# Patient Record
Sex: Male | Born: 1980 | Hispanic: Yes | Smoking: Never smoker
Health system: Southern US, Community
[De-identification: ages and names within clinical notes are randomized; demographics above are authoritative.]

---

## 2014-12-19 ENCOUNTER — Emergency Department (HOSPITAL_COMMUNITY): Payer: Self-pay

## 2014-12-19 ENCOUNTER — Emergency Department (HOSPITAL_COMMUNITY)
Admission: EM | Admit: 2014-12-19 | Discharge: 2014-12-19 | Disposition: A | Discharge status: Home / Self Care | Payer: Self-pay | Attending: Emergency Medicine | Admitting: Emergency Medicine

## 2014-12-19 ENCOUNTER — Encounter (HOSPITAL_COMMUNITY): Payer: Self-pay | Admitting: Emergency Medicine

## 2014-12-19 DIAGNOSIS — N200 Calculus of kidney: Secondary | ICD-10-CM | POA: Insufficient documentation

## 2014-12-19 LAB — URINALYSIS, ROUTINE W REFLEX MICROSCOPIC
BILIRUBIN URINE: NEGATIVE
GLUCOSE, UA: NEGATIVE mg/dL
Ketones, ur: NEGATIVE mg/dL
Leukocytes, UA: NEGATIVE
Nitrite: NEGATIVE
PROTEIN: 30 mg/dL — AB
Specific Gravity, Urine: 1.024 (ref 1.005–1.030)
UROBILINOGEN UA: 1 mg/dL (ref 0.0–1.0)
pH: 5 (ref 5.0–8.0)

## 2014-12-19 LAB — CBC WITH DIFFERENTIAL/PLATELET
Basophils Absolute: 0 10*3/uL (ref 0.0–0.1)
Basophils Relative: 1 % (ref 0–1)
EOS PCT: 2 % (ref 0–5)
Eosinophils Absolute: 0.2 10*3/uL (ref 0.0–0.7)
HCT: 41.2 % (ref 39.0–52.0)
HEMOGLOBIN: 13.7 g/dL (ref 13.0–17.0)
Lymphocytes Relative: 28 % (ref 12–46)
Lymphs Abs: 2.4 10*3/uL (ref 0.7–4.0)
MCH: 27.2 pg (ref 26.0–34.0)
MCHC: 33.3 g/dL (ref 30.0–36.0)
MCV: 81.7 fL (ref 78.0–100.0)
MONO ABS: 0.5 10*3/uL (ref 0.1–1.0)
MONOS PCT: 6 % (ref 3–12)
Neutro Abs: 5.4 10*3/uL (ref 1.7–7.7)
Neutrophils Relative %: 63 % (ref 43–77)
Platelets: 210 10*3/uL (ref 150–400)
RBC: 5.04 MIL/uL (ref 4.22–5.81)
RDW: 13.2 % (ref 11.5–15.5)
WBC: 8.5 10*3/uL (ref 4.0–10.5)

## 2014-12-19 LAB — COMPREHENSIVE METABOLIC PANEL
ALT: 113 U/L — AB (ref 0–53)
AST: 67 U/L — AB (ref 0–37)
Albumin: 4.1 g/dL (ref 3.5–5.2)
Alkaline Phosphatase: 91 U/L (ref 39–117)
Anion gap: 10 (ref 5–15)
BILIRUBIN TOTAL: 1.1 mg/dL (ref 0.3–1.2)
BUN: 14 mg/dL (ref 6–23)
CO2: 25 mmol/L (ref 19–32)
Calcium: 9.2 mg/dL (ref 8.4–10.5)
Chloride: 106 mEq/L (ref 96–112)
Creatinine, Ser: 1.12 mg/dL (ref 0.50–1.35)
GFR calc Af Amer: >90 mL/min (ref >90)
GFR, EST NON AFRICAN AMERICAN: 86 mL/min — AB (ref >90)
GLUCOSE: 125 mg/dL — AB (ref 70–99)
Potassium: 3.7 mmol/L (ref 3.5–5.1)
Sodium: 141 mmol/L (ref 135–145)
Total Protein: 7.7 g/dL (ref 6.0–8.3)

## 2014-12-19 LAB — URINE MICROSCOPIC-ADD ON

## 2014-12-19 MED ORDER — HYDROMORPHONE HCL 1 MG/ML IJ SOLN
1.0000 mg | Freq: Once | INTRAMUSCULAR | Status: AC
  Administered 2014-12-19: 1 mg via INTRAVENOUS
  Filled 2014-12-19: qty 1

## 2014-12-19 MED ORDER — TAMSULOSIN HCL 0.4 MG PO CAPS: 0.4000 mg | ORAL_CAPSULE | Freq: Every day | ORAL | Status: DC

## 2014-12-19 MED ORDER — PROMETHAZINE HCL 25 MG/ML IJ SOLN: 25.0000 mg | Freq: Once | INTRAMUSCULAR | Status: DC

## 2014-12-19 MED ORDER — IOHEXOL 300 MG/ML  SOLN: 25.0000 mL | INTRAMUSCULAR | Status: AC

## 2014-12-19 MED ORDER — SODIUM CHLORIDE 0.9 % IV BOLUS (SEPSIS)
1000.0000 mL | Freq: Once | INTRAVENOUS | Status: AC
  Administered 2014-12-19: 1000 mL via INTRAVENOUS

## 2014-12-19 MED ORDER — OXYCODONE-ACETAMINOPHEN 5-325 MG PO TABS
1.0000 | ORAL_TABLET | Freq: Once | ORAL | Status: AC
  Administered 2014-12-19: 1 via ORAL
  Filled 2014-12-19: qty 1

## 2014-12-19 MED ORDER — ONDANSETRON HCL 4 MG/2ML IJ SOLN
4.0000 mg | Freq: Once | INTRAMUSCULAR | Status: AC
  Administered 2014-12-19: 4 mg via INTRAVENOUS
  Filled 2014-12-19: qty 2

## 2014-12-19 MED ORDER — TAMSULOSIN HCL 0.4 MG PO CAPS
0.4000 mg | ORAL_CAPSULE | Freq: Once | ORAL | Status: AC
  Administered 2014-12-19: 0.4 mg via ORAL
  Filled 2014-12-19: qty 1

## 2014-12-19 MED ORDER — KETOROLAC TROMETHAMINE 30 MG/ML IJ SOLN
30.0000 mg | Freq: Once | INTRAMUSCULAR | Status: AC
Start: 2014-12-19 — End: 2014-12-19
  Administered 2014-12-19: 30 mg via INTRAVENOUS
  Filled 2014-12-19: qty 1

## 2014-12-19 MED ORDER — HYDROCODONE-ACETAMINOPHEN 5-325 MG PO TABS: 1.0000 | ORAL_TABLET | Freq: Four times a day (QID) | ORAL | Status: DC | PRN

## 2014-12-19 NOTE — ED Provider Notes (Signed)
CSN: [id number]     Arrival date & time 12/19/14  1431 History   First MD Initiated Contact with Patient 12/19/14 1712     Chief Complaint  Patient presents with  . Abdominal Pain     (Consider location/radiation/quality/duration/timing/severity/associated sxs/prior Treatment) HPI Patient presents with right lower quadrant abdominal pain.  Pain is nonradiating, sore, severe, worse with past 24 hours. There is associated nausea, but no vomiting. Patient notes decreased bowel movements during this time period. He denies fever. He was generally well prior to the onset of symptoms. He has no medical problems. No relief with OTC medication.  History reviewed. No pertinent past medical history. History reviewed. No pertinent past surgical history. History reviewed. No pertinent family history. History  Substance Use Topics  . Smoking status: Never Smoker   . Smokeless tobacco: Not on file  . Alcohol Use: No    Review of Systems  Constitutional:       Per HPI, otherwise negative  HENT:       Per HPI, otherwise negative  Respiratory:       Per HPI, otherwise negative  Cardiovascular:       Per HPI, otherwise negative  Gastrointestinal: Negative for vomiting.  Endocrine:       Negative aside from HPI  Genitourinary:       Neg aside from HPI   Musculoskeletal:       Per HPI, otherwise negative  Skin: Negative.   Neurological: Negative for syncope.      Allergies  Review of patient's allergies indicates no known allergies.  Home Medications   Prior to Admission medications   Medication Sig Start Date End Date Taking? Authorizing Provider  acetaminophen (TYLENOL) 500 MG tablet Take 1,000 mg by mouth every 8 (eight) hours as needed (pain).   Yes Historical Provider, MD   BP 119/65 mmHg  Pulse 65  Temp(Src) 98.3 F (36.8 C) (Oral)  Resp 18  SpO2 96% Physical Exam  Constitutional: He is oriented to person, place, and time. He appears well-developed. No distress.   HENT:  Head: Normocephalic and atraumatic.  Eyes: Conjunctivae and EOM are normal.  Cardiovascular: Normal rate and regular rhythm.   Pulmonary/Chest: Effort normal. No stridor. No respiratory distress.  Abdominal: He exhibits no distension. There is tenderness. There is tenderness at McBurney's point.  Musculoskeletal: He exhibits no edema.  Neurological: He is alert and oriented to person, place, and time.  Skin: Skin is warm and dry.  Psychiatric: He has a normal mood and affect.  Nursing note and vitals reviewed.   ED Course  Procedures (including critical care time) Labs Review Labs Reviewed  COMPREHENSIVE METABOLIC PANEL - Abnormal; Notable for the following:    Glucose, Bld 125 (*)    AST 67 (*)    ALT 113 (*)    GFR calc non Af Amer 86 (*)    All other components within normal limits  URINALYSIS, ROUTINE W REFLEX MICROSCOPIC - Abnormal; Notable for the following:    Color, Urine AMBER (*)    APPearance CLOUDY (*)    Hgb urine dipstick LARGE (*)    Protein, ur 30 (*)    All other components within normal limits  URINE MICROSCOPIC-ADD ON - Abnormal; Notable for the following:    Bacteria, UA FEW (*)    All other components within normal limits  CBC WITH DIFFERENTIAL    Imaging Review Ct Abdomen Pelvis Wo Contrast  12/19/2014   CLINICAL DATA:  Right lower quadrant  pain, intermittent, for 3 weeks  EXAM: CT ABDOMEN AND PELVIS WITHOUT CONTRAST  TECHNIQUE: Multidetector CT imaging of the abdomen and pelvis was performed following the standard protocol without IV contrast.  COMPARISON:  None.  FINDINGS: Visualized portions of the lung bases clear.  Significant diffuse hepatic steatosis. Gallbladder and spleen are normal. Pancreas is normal. Adrenal glands are normal.  Left kidney is normal. There is mild perinephric inflammatory change on the right. There is mild to moderate right hydronephrosis. There is a 1 mm stone in the midpole of the right kidney. The entire right ureter  is mildly to moderately dilated and there is a 6 mm stone at the right ureterovesical junction.  Bladder and reproductive organs are normal. Small bowel and large bowel are normal. Stomach is normal.  Appendix shows no evidence of dilatation or inflammation. There is a 3 mm at appendicolith incidentally noted. There are a few small right lower quadrant mesenteric lymph nodes. No significant retroperitoneal adenopathy.  No free fluid.  No acute musculoskeletal findings.  IMPRESSION: Obstructive nephropathy on the right due to 6 mm stone in the distal right ureter at the right ureterovesical junction.   Electronically Signed   By: Esperanza Heir M.D.   On: 12/19/2014 21:05    On repeat exam the patient appears more comfortable. We discussed all findings. Patient will receive narcotics, be reassessed.  Patient is now sleeping  MDM   Patient presents with ongoing right lower quadrant abdominal pain. Patient is not hemodynamically unstable, and labs are largely reassuring. Evaluation demonstrates the presence of a 6 mm kidney stone. No evidence for concurrent infection. Patient was sleeping after provision of analgesics. Patient was discharged after initiation of Flomax, analgesia, to follow-up with urology.    Gerhard Munch, MD 12/19/14 2257

## 2014-12-19 NOTE — ED Notes (Signed)
The pt s pain is still a 7/10.  The pt has to be awakened to ask for pain scale

## 2014-12-19 NOTE — Discharge Instructions (Signed)
Cálculos renales °(Kidney Stones) °Los cálculos renales (urolitiasis) son masas sólidas que se forman en el interior de los riñones. El dolor intenso es causado por el movimiento de la piedra a través del riñón, uréter, vejiga y uretra (tracto urinario). Cuando la piedra se mueve, el uréter hace un espasmo alrededor de la misma. El cálculo generalmente se elimina con el pis (la orina).  °CUIDADOS EN EL HOGAR °· Debe ingerir gran cantidad de líquido para mantener la orina de tono claro o color amarillo pálido. Esto ayudará a eliminar la piedra. °· Cuele la orina con el colador que le han provisto. Noorine de otra forma que no sea a través del colador, ni siquiera una vez. Si elimina la piedra, se retendrá en el colador. Puede ser tan pequeña como un grano de sal. Llévela a su visita con el médico. Esto ayudará a que el médico le indique qué puede hacer para tratar de prevenir la ocurrencia de nuevos cálculos renales. °· Sólo tome los medicamentos que le haya indicado su médico. °· Concurra a las consultas de control con el médico, según las indicaciones. °· Hágase las radiografías según las indicaciones de su médico. °SOLICITE AYUDA SI: °Siente un dolor que empeora aún tomando analgésicos. °SOLICITE AYUDA DE INMEDIATO SI:  °· El dolor no mejora con los medicamentos recetados. °· Siente escalofríos o fiebre. °· El dolor aumenta y empeora en las siguientes 18 horas. °· Siente un nuevo dolor en el vientre (abdominal). °· Sufre mareos o se desmaya. °· Nota que no puede orinar. °ASEGÚRESE DE QUE:  °· Comprende estas instrucciones. °· Controlará su afección. °· Recibirá ayuda de inmediato si no mejora o si empeora. °Document Released: 03/01/2009 Document Revised: 08/05/2013 °[employer]® Patient Information ©2015 [employer]. This information is not intended to replace advice given to you by your health care provider. Make sure you discuss any questions you have with your health care provider. ° °

## 2014-12-19 NOTE — ED Notes (Signed)
Pt c/o RLQ pain x 3 weeks intermittently

## 2014-12-19 NOTE — ED Notes (Signed)
Pt drinking 2nd cup of oral contrast

## 2014-12-19 NOTE — ED Notes (Signed)
To ct

## 2014-12-19 NOTE — ED Notes (Signed)
The pt is drinking oral contrast.  1st cup completed/   Still hurting

## 2016-12-01 ENCOUNTER — Emergency Department (HOSPITAL_COMMUNITY)
Admission: EM | Admit: 2016-12-01 | Discharge: 2016-12-01 | Disposition: A | Discharge status: Home / Self Care | Payer: Self-pay | Attending: Emergency Medicine | Admitting: Emergency Medicine

## 2016-12-01 ENCOUNTER — Encounter (HOSPITAL_COMMUNITY): Payer: Self-pay | Admitting: *Deleted

## 2016-12-01 DIAGNOSIS — N492 Inflammatory disorders of scrotum: Secondary | ICD-10-CM | POA: Insufficient documentation

## 2016-12-01 DIAGNOSIS — N481 Balanitis: Secondary | ICD-10-CM

## 2016-12-01 MED ORDER — NYSTATIN 100000 UNIT/GM EX CREA: TOPICAL_CREAM | CUTANEOUS | 0 refills | Status: DC

## 2016-12-01 MED ORDER — LIDOCAINE HCL 2 % IJ SOLN
10.0000 mL | Freq: Once | INTRAMUSCULAR | Status: AC
  Administered 2016-12-01: 200 mg
  Filled 2016-12-01: qty 20

## 2016-12-01 MED ORDER — SULFAMETHOXAZOLE-TRIMETHOPRIM 800-160 MG PO TABS: 1.0000 | ORAL_TABLET | Freq: Two times a day (BID) | ORAL | 0 refills | Status: AC

## 2016-12-01 MED ORDER — CEPHALEXIN 500 MG PO CAPS: 500.0000 mg | ORAL_CAPSULE | Freq: Four times a day (QID) | ORAL | 0 refills | Status: DC

## 2016-12-01 NOTE — ED Triage Notes (Signed)
The pt has an abscess in his genatalia  For 2-3 weeks and his penis is irritated

## 2016-12-01 NOTE — ED Notes (Signed)
Pt. Speaks very little English, if any...his wife is in the room with him and speaks English very well

## 2016-12-01 NOTE — ED Notes (Signed)
ED Provider at bedside. 

## 2016-12-01 NOTE — ED Provider Notes (Signed)
[facility]-EMERGENCY DEPT Provider Note   CSN: [id number] Arrival date & time: 12/01/16  1829   History   Chief Complaint Chief Complaint  Patient presents with  . Abscess    HPI Alan Vaughn is a 35 y.o. male.  35 year old male with no significant past medical history presents to the emergency department for evaluation of abscess. Patient has had an abscess over the past 4 days. It has slightly increased in size and has not been draining. Wife reports the patient tried to squeeze the abscess to drain it prior to arrival, but did not expel any. Once. He has mild discomfort to the area and has not taken any medications for symptoms. No dysuria or scrotal pain. No fevers or history of abscesses.   Wife is also concerned about your rotation to the patient's foreskin. This has been present for 2-3 weeks, the patient has had similar symptoms in the past which have resolved spontaneously. Irritation to the area is worse when retracting the foreskin. Patient has no history of diabetes and he has had no bloody drainage. No inability to void.   The history is provided by the patient. A language interpreter was used (girlfriend translating).    History reviewed. No pertinent past medical history.  There are no active problems to display for this patient.   History reviewed. No pertinent surgical history.    Home Medications    Prior to Admission medications   Medication Sig Start Date End Date Taking? Authorizing Provider  cephALEXin (KEFLEX) 500 MG capsule Take 1 capsule (500 mg total) by mouth 4 (four) times daily. 12/01/16   Antony MaduraKelly Ghazi Rumpf, PA-C  HYDROcodone-acetaminophen (NORCO/VICODIN) 5-325 MG per tablet Take 1 tablet by mouth every 6 (six) hours as needed for moderate pain or severe pain. 12/19/14   Gerhard Munchobert Lockwood, MD  nystatin cream (MYCOSTATIN) Apply to foreskin 2 times daily 12/01/16   Antony MaduraKelly Jonahtan Manseau, PA-C  sulfamethoxazole-trimethoprim (BACTRIM DS,SEPTRA DS) 800-160 MG tablet Take 1  tablet by mouth 2 (two) times daily. 12/01/16 12/08/16  Antony MaduraKelly Winfield Caba, PA-C  tamsulosin (FLOMAX) 0.4 MG CAPS capsule Take 1 capsule (0.4 mg total) by mouth daily. 12/20/14   Gerhard Munchobert Lockwood, MD    Family History No family history on file.  Social History Social History  Substance Use Topics  . Smoking status: Never Smoker  . Smokeless tobacco: Never Used  . Alcohol use No     Allergies   Patient has no known allergies.   Review of Systems Review of Systems Ten systems reviewed and are negative for acute change, except as noted in the HPI.    Physical Exam Updated Vital Signs BP 106/69   Pulse 69   Temp 99.1 F (37.3 C) (Oral)   Resp 16   SpO2 97%   Physical Exam  Constitutional: He is oriented to person, place, and time. He appears well-developed and well-nourished. No distress.  Nontoxic and in NAD  HENT:  Head: Normocephalic and atraumatic.  Eyes: Conjunctivae and EOM are normal. No scleral icterus.  Neck: Normal range of motion.  Cardiovascular: Normal rate, regular rhythm and intact distal pulses.   Pulmonary/Chest: Effort normal. No respiratory distress. He has no wheezes.  Respirations even and unlabored  Genitourinary: Uncircumcised.  Genitourinary Comments: Irritation to end of foreskin with small cuts in skin and discharge; c/w balanitis. There is also an indurated abscess to the underside of the base of the penis, also associated with the anterior aspect of the scrotum. Mild TTP. No erythema or heat to  touch. No drainage.  Musculoskeletal: Normal range of motion.  Neurological: He is alert and oriented to person, place, and time. He exhibits normal muscle tone. Coordination normal.  Skin: Skin is warm and dry. No rash noted. He is not diaphoretic. No erythema. No pallor.  Psychiatric: He has a normal mood and affect. His behavior is normal.  Nursing note and vitals reviewed.    ED Treatments / Results  Labs (all labs ordered are listed, but only abnormal  results are displayed) Labs Reviewed - No data to display  EKG  EKG Interpretation None       Radiology No results found.  Procedures Procedures (including critical care time)  Medications Ordered in ED Medications  lidocaine (XYLOCAINE) 2 % (with pres) injection 200 mg (200 mg Infiltration Given by Other 12/01/16 2222)     INCISION AND DRAINAGE Performed by: Antony MaduraHUMES, Colleen Kotlarz Consent: Verbal consent obtained. Risks and benefits: risks, benefits and alternatives were discussed Type: abscess  Body area: left hemiscrotum  Anesthesia: local infiltration  Incision was made with a scalpel.  Local anesthetic: lidocaine 2% without epinephrine  Anesthetic total: 1 ml  Complexity: complex Blunt dissection to break up loculations  Drainage: purulent  Drainage amount: moderate  Packing material: none  Patient tolerance: Patient tolerated the procedure well with no immediate complications.    Initial Impression / Assessment and Plan / ED Course  I have reviewed the triage vital signs and the nursing notes.  Pertinent labs & imaging results that were available during my care of the patient were reviewed by me and considered in my medical decision making (see chart for details).  Clinical Course     Patient with skin abscess amenable to incision and drainage. Abscess was not large enough to warrant packing or drain. Wound recheck in 2 days advised. Encouraged home warm soaks and flushing. Mild signs of cellulitis is surrounding skin. Will d/c to home with Bactrim and Keflex. Nystatin also provided for balanitis; Keflex providing dual coverage in the treatment of abscess and balanitis. Return precautions discussed and provided. Patient discharged in stable condition with no unaddressed concerns.   Final Clinical Impressions(s) / ED Diagnoses   Final diagnoses:  Abscess, scrotum  Balanitis    New Prescriptions New Prescriptions   CEPHALEXIN (KEFLEX) 500 MG CAPSULE     Take 1 capsule (500 mg total) by mouth 4 (four) times daily.   NYSTATIN CREAM (MYCOSTATIN)    Apply to foreskin 2 times daily   SULFAMETHOXAZOLE-TRIMETHOPRIM (BACTRIM DS,SEPTRA DS) 800-160 MG TABLET    Take 1 tablet by mouth 2 (two) times daily.     Antony MaduraKelly Jaxon Flatt, PA-C 12/01/16 2315    Jacalyn LefevreJulie Haviland, MD 12/01/16 [id number]

## 2016-12-01 NOTE — Discharge Instructions (Signed)
Take Bactrim and Keflex until finished. Take tylenol or ibuprofen for pain. Use Nystatin to your foreskin for balanitis. Apply warm compresses to the area of your abscess 3-4 times per day to promote drainage. Follow up with a primary care doctor.

## 2018-09-14 ENCOUNTER — Emergency Department (HOSPITAL_COMMUNITY)
Admission: EM | Admit: 2018-09-14 | Discharge: 2018-09-14 | Disposition: A | Discharge status: Home / Self Care | Payer: Self-pay | Attending: Emergency Medicine | Admitting: Emergency Medicine

## 2018-09-14 ENCOUNTER — Other Ambulatory Visit: Payer: Self-pay

## 2018-09-14 DIAGNOSIS — Z79899 Other long term (current) drug therapy: Secondary | ICD-10-CM | POA: Insufficient documentation

## 2018-09-14 DIAGNOSIS — L03811 Cellulitis of head [any part, except face]: Secondary | ICD-10-CM | POA: Insufficient documentation

## 2018-09-14 MED ORDER — DOXYCYCLINE HYCLATE 100 MG PO CAPS: 100.0000 mg | ORAL_CAPSULE | Freq: Two times a day (BID) | ORAL | 0 refills | Status: DC

## 2018-09-14 MED ORDER — IBUPROFEN 600 MG PO TABS: 600.0000 mg | ORAL_TABLET | Freq: Four times a day (QID) | ORAL | 0 refills | Status: DC | PRN

## 2018-09-14 NOTE — Discharge Instructions (Addendum)
You were seen today for infection of the head.  You likely have a skin infection.  There is no obvious abscess at this time.  Continue warm compresses.  Take antibiotics as prescribed.  Avoid shaving in that area.  If you notice increasing swelling, redness, any new or worsening symptoms you should be reevaluated.

## 2018-09-14 NOTE — ED Triage Notes (Signed)
Patient c/o abscess to back of head/neck area. Painful and hard.

## 2018-09-14 NOTE — ED Provider Notes (Signed)
[facility] EMERGENCY DEPARTMENT Provider Note   CSN: [id number] Arrival date & time: 09/14/18  0019     History   Chief Complaint Chief Complaint  Patient presents with  . Abscess    HPI Alan Vaughn is a 37 y.o. male.  HPI  This is a 37 year old male who presents with pain and swelling of the right occiput.  Patient's family members at the bedside.  Reports 1 week history of pain and swelling to the right side of the occiput.  She thought it was related to him shaving.  She has been using warm compresses with no relief.  No spontaneous drainage noted.  She has not noted any increasing swelling.  No fevers or redness.  Patient does report the pain worse at night when laying down and with head movements.  Currently he rates his pain at 5 out of 10.  He has not taken anything for pain.  No past medical history on file.  There are no active problems to display for this patient.   No past surgical history on file.      Home Medications    Prior to Admission medications   Medication Sig Start Date End Date Taking? Authorizing Provider  cephALEXin (KEFLEX) 500 MG capsule Take 1 capsule (500 mg total) by mouth 4 (four) times daily. 12/01/16   Antony Madura, PA-C  doxycycline (VIBRAMYCIN) 100 MG capsule Take 1 capsule (100 mg total) by mouth 2 (two) times daily. 09/14/18   Micheala Morissette, Mayer Masker, MD  HYDROcodone-acetaminophen (NORCO/VICODIN) 5-325 MG per tablet Take 1 tablet by mouth every 6 (six) hours as needed for moderate pain or severe pain. 12/19/14   Gerhard Munch, MD  ibuprofen (ADVIL,MOTRIN) 600 MG tablet Take 1 tablet (600 mg total) by mouth every 6 (six) hours as needed. 09/14/18   Falesha Schommer, Mayer Masker, MD  nystatin cream (MYCOSTATIN) Apply to foreskin 2 times daily 12/01/16   Antony Madura, PA-C  tamsulosin (FLOMAX) 0.4 MG CAPS capsule Take 1 capsule (0.4 mg total) by mouth daily. 12/20/14   Gerhard Munch, MD    Family History No family history on  file.  Social History Social History   Tobacco Use  . Smoking status: Never Smoker  . Smokeless tobacco: Never Used  Substance Use Topics  . Alcohol use: No  . Drug use: No     Allergies   Patient has no known allergies.   Review of Systems Review of Systems  Constitutional: Negative for fever.  Skin: Negative for color change.       Pain and swelling right occipital area  Neurological: Negative for headaches.  All other systems reviewed and are negative.    Physical Exam Updated Vital Signs BP (!) 147/84 (BP Location: Right Arm)   Pulse 62   Temp 97.7 F (36.5 C) (Oral)   Resp 16   Wt 90.7 kg   SpO2 99%   Physical Exam  Constitutional: He is oriented to person, place, and time. He appears well-developed and well-nourished. No distress.  HENT:  Head: Normocephalic and atraumatic.  Tenderness to palpation right occipital area with associated induration, no obvious fluctuance, mild erythema, no no drainage or pustules noted  Neck: Normal range of motion. Neck supple.  Cardiovascular: Normal rate and regular rhythm.  Pulmonary/Chest: Effort normal. No respiratory distress.  Musculoskeletal: He exhibits no edema.  Neurological: He is alert and oriented to person, place, and time.  Skin: Skin is warm and dry.  Psychiatric: He has a normal  mood and affect.  Nursing note and vitals reviewed.    ED Treatments / Results  Labs (all labs ordered are listed, but only abnormal results are displayed) Labs Reviewed - No data to display  EKG None  Radiology No results found.  Procedures Procedures (including critical care time)  Medications Ordered in ED Medications - No data to display   Initial Impression / Assessment and Plan / ED Course  I have reviewed the triage vital signs and the nursing notes.  Pertinent labs & imaging results that were available during my care of the patient were reviewed by me and considered in my medical decision making (see  chart for details).     Patient presents with pain and swelling to the right occipital region.  He is overall nontoxic-appearing vital signs are reassuring.  Denies any fevers or systemic symptoms.  He has slight swelling and tenderness to palpation over the right occipital region with slight erythema.  No obvious fluctuance.  Bedside ultrasound does not show any definitive abscess.  Suspect cellulitis versus folliculitis.  Recommend continued warm compresses.  Will start on doxycycline.  They were given strict return precautions.  Informed patient that this could turn into an abscess which would ultimately require I&D if it increases in size.  After history, exam, and medical workup I feel the patient has been appropriately medically screened and is safe for discharge home. Pertinent diagnoses were discussed with the patient. Patient was given return precautions.   Final Clinical Impressions(s) / ED Diagnoses   Final diagnoses:  Cellulitis of head except face    ED Discharge Orders         Ordered    doxycycline (VIBRAMYCIN) 100 MG capsule  2 times daily     09/14/18 0214    ibuprofen (ADVIL,MOTRIN) 600 MG tablet  Every 6 hours PRN     09/14/18 0214           Shon Baton, MD 09/14/18 [phone]

## 2018-09-14 NOTE — ED Notes (Signed)
Patient is A&Ox4.  No signs of distress noted.  Please see providers complete history and physical exam.  

## 2018-09-14 NOTE — ED Notes (Signed)
PT states understanding of care given, follow up care, and medication prescribed. PT ambulated from ED to car with a steady gait. 

## 2021-01-16 ENCOUNTER — Ambulatory Visit: Payer: Self-pay | Admitting: Family Medicine

## 2021-08-26 ENCOUNTER — Encounter (HOSPITAL_COMMUNITY): Payer: Self-pay | Admitting: Emergency Medicine

## 2021-08-26 ENCOUNTER — Emergency Department (HOSPITAL_COMMUNITY): Payer: Self-pay

## 2021-08-26 ENCOUNTER — Emergency Department (HOSPITAL_COMMUNITY)
Admission: EM | Admit: 2021-08-26 | Discharge: 2021-08-26 | Disposition: A | Discharge status: Home / Self Care | Payer: Self-pay | Attending: Emergency Medicine | Admitting: Emergency Medicine

## 2021-08-26 DIAGNOSIS — Y990 Civilian activity done for income or pay: Secondary | ICD-10-CM | POA: Insufficient documentation

## 2021-08-26 DIAGNOSIS — W19XXXA Unspecified fall, initial encounter: Secondary | ICD-10-CM

## 2021-08-26 DIAGNOSIS — M546 Pain in thoracic spine: Secondary | ICD-10-CM | POA: Insufficient documentation

## 2021-08-26 DIAGNOSIS — M25522 Pain in left elbow: Secondary | ICD-10-CM | POA: Insufficient documentation

## 2021-08-26 DIAGNOSIS — M549 Dorsalgia, unspecified: Secondary | ICD-10-CM

## 2021-08-26 DIAGNOSIS — W1789XA Other fall from one level to another, initial encounter: Secondary | ICD-10-CM | POA: Insufficient documentation

## 2021-08-26 DIAGNOSIS — R42 Dizziness and giddiness: Secondary | ICD-10-CM | POA: Insufficient documentation

## 2021-08-26 DIAGNOSIS — M25512 Pain in left shoulder: Secondary | ICD-10-CM | POA: Insufficient documentation

## 2021-08-26 DIAGNOSIS — M25511 Pain in right shoulder: Secondary | ICD-10-CM | POA: Insufficient documentation

## 2021-08-26 LAB — I-STAT CHEM 8, ED
BUN: 9 mg/dL (ref 6–20)
Calcium, Ion: 1.23 mmol/L (ref 1.15–1.40)
Chloride: 102 mmol/L (ref 98–111)
Creatinine, Ser: 0.8 mg/dL (ref 0.61–1.24)
Glucose, Bld: 196 mg/dL — ABNORMAL HIGH (ref 70–99)
HCT: 40 % (ref 39.0–52.0)
Hemoglobin: 13.6 g/dL (ref 13.0–17.0)
Potassium: 3.7 mmol/L (ref 3.5–5.1)
Sodium: 140 mmol/L (ref 135–145)
TCO2: 27 mmol/L (ref 22–32)

## 2021-08-26 MED ORDER — IOHEXOL 350 MG/ML SOLN
100.0000 mL | Freq: Once | INTRAVENOUS | Status: AC | PRN
  Administered 2021-08-26: 100 mL via INTRAVENOUS

## 2021-08-26 MED ORDER — HYDROCODONE-ACETAMINOPHEN 5-325 MG PO TABS
1.0000 | ORAL_TABLET | Freq: Once | ORAL | Status: AC
  Administered 2021-08-26: 1 via ORAL
  Filled 2021-08-26: qty 1

## 2021-08-26 MED ORDER — CYCLOBENZAPRINE HCL 10 MG PO TABS: 10.0000 mg | ORAL_TABLET | Freq: Every evening | ORAL | 0 refills | Status: DC | PRN

## 2021-08-26 NOTE — ED Triage Notes (Addendum)
Pt fell approx 15 ft off a roof yesterday at work.  Denies LOC.  Mild dizziness post-fall but denies at present.  C/o L shoulder and L arm pain and mild pain to R arm.  Ambulatory to triage.

## 2021-08-26 NOTE — ED Notes (Signed)
Pt verbalized understanding of d/c instructions, meds and followup care. Denies questions. VSS, no distress noted. Steady gait to exit.  

## 2021-08-26 NOTE — ED Provider Notes (Signed)
[facility] EMERGENCY DEPARTMENT Provider Note   CSN: [id number] Arrival date & time: 08/26/21  1027     History No chief complaint on file.   Alan Vaughn is a 40 y.o. male.  HPI Patient is a 40 year old male who presents to the emergency department due to a fall that occurred yesterday while at work.  Patient states he was working on a roof and fell about 15 feet to the ground.  States that he fell on his left side and reports pain to his left upper extremity diffusely but states it is worse along the left shoulder and elbow.  Also reports neck pain, upper back pain, as well as right arm pain.  States that he was holding a board in his right arm that subsequently landed on it when he fell resulting in his pain in the right shoulder.  States his pain was initially mild yesterday but worsened significantly after waking up this morning so he came to the emergency department.  Denies any head trauma or LOC during the fall but states that he was dizzy/lightheaded for about 5 minutes after hitting the ground and states that his symptoms then completely resolved.  Denies any recurrent dizziness or lightheadedness.  Denies any abdominal pain.    History reviewed. No pertinent past medical history.  There are no problems to display for this patient.   History reviewed. No pertinent surgical history.     No family history on file.  Social History   Tobacco Use   Smoking status: Never   Smokeless tobacco: Never  Substance Use Topics   Alcohol use: No   Drug use: No    Home Medications Prior to Admission medications   Medication Sig Start Date End Date Taking? Authorizing Provider  cyclobenzaprine (FLEXERIL) 10 MG tablet Take 1 tablet (10 mg total) by mouth at bedtime as needed for muscle spasms. 08/26/21  Yes Placido Sou, PA-C  cephALEXin (KEFLEX) 500 MG capsule Take 1 capsule (500 mg total) by mouth 4 (four) times daily. 12/01/16   Antony Madura, PA-C   doxycycline (VIBRAMYCIN) 100 MG capsule Take 1 capsule (100 mg total) by mouth 2 (two) times daily. 09/14/18   Horton, Mayer Masker, MD  HYDROcodone-acetaminophen (NORCO/VICODIN) 5-325 MG per tablet Take 1 tablet by mouth every 6 (six) hours as needed for moderate pain or severe pain. 12/19/14   Gerhard Munch, MD  ibuprofen (ADVIL,MOTRIN) 600 MG tablet Take 1 tablet (600 mg total) by mouth every 6 (six) hours as needed. 09/14/18   Horton, Mayer Masker, MD  nystatin cream (MYCOSTATIN) Apply to foreskin 2 times daily 12/01/16   Antony Madura, PA-C  tamsulosin (FLOMAX) 0.4 MG CAPS capsule Take 1 capsule (0.4 mg total) by mouth daily. 12/20/14   Gerhard Munch, MD    Allergies    Patient has no known allergies.  Review of Systems   Review of Systems  All other systems reviewed and are negative. Ten systems reviewed and are negative for acute change, except as noted in the HPI.   Physical Exam Updated Vital Signs BP 123/77 (BP Location: Right Arm)   Pulse (!) 57   Temp 98.6 F (37 C) (Oral)   Resp 19   SpO2 97%   Physical Exam Vitals and nursing note reviewed.  Constitutional:      General: He is not in acute distress.    Appearance: Normal appearance. He is not ill-appearing, toxic-appearing or diaphoretic.  HENT:     Head: Normocephalic and atraumatic.  Comments: No visible signs of trauma.    Right Ear: External ear normal.     Left Ear: External ear normal.     Nose: Nose normal.     Mouth/Throat:     Mouth: Mucous membranes are moist.     Pharynx: Oropharynx is clear. No oropharyngeal exudate or posterior oropharyngeal erythema.  Eyes:     Extraocular Movements: Extraocular movements intact.  Neck:     Comments: No midline C, T, or L-spine tenderness.  No step-offs, crepitus, or deformities. Cardiovascular:     Rate and Rhythm: Normal rate and regular rhythm.     Pulses: Normal pulses.     Heart sounds: Normal heart sounds. No murmur heard.   No friction rub. No gallop.   Pulmonary:     Effort: Pulmonary effort is normal. No respiratory distress.     Breath sounds: Normal breath sounds. No stridor. No wheezing, rhonchi or rales.     Comments: Lungs are clear to auscultation bilaterally.  No wheezing, rales, or rhonchi.  Symmetrical rise and fall the chest with inspiration and expiration.  No flail chest. Abdominal:     General: Abdomen is flat.     Palpations: Abdomen is soft.     Tenderness: There is no abdominal tenderness.     Comments: Abdomen flat, soft, and nontender.  No visible signs of trauma.  No ecchymosis.  Musculoskeletal:        General: Tenderness present. Normal range of motion.     Cervical back: Normal range of motion and neck supple. No tenderness.     Comments: Mild tenderness appreciated circumferentially overlying the right shoulder.  Full active and passive range of motion of the right shoulder.  No tenderness appreciated in the right elbow or wrist.  Strength is 5/5 in the right arm.  Grip strength intact.  2+ radial pulses.  Distal sensation intact.  Moderate tenderness noted circumferentially around the left shoulder.  Unable to assess range of motion due to patient's pain.  Additional moderate tenderness noted along the olecranon and bilateral epicondyles of the left elbow.  Full passive range of motion of the left elbow.  No overlying signs of trauma.  No tenderness appreciated in the left wrist.  2+ radial pulses.  Distal sensation intact.  Mild tenderness noted diffusely along the bilateral trapezius musculature.  Skin:    General: Skin is warm and dry.  Neurological:     General: No focal deficit present.     Mental Status: He is alert and oriented to person, place, and time.     Comments: A&O x3.  Speaking clearly, coherently, and in complete sentences.  Difficulty moving the left upper extremity secondary to pain but otherwise moving the other 3 extremities with ease.  Able to stand and ambulate unassisted with a steady gait.   Psychiatric:        Mood and Affect: Mood normal.        Behavior: Behavior normal.   ED Results / Procedures / Treatments   Labs (all labs ordered are listed, but only abnormal results are displayed) Labs Reviewed  I-STAT CHEM 8, ED - Abnormal; Notable for the following components:      Result Value   Glucose, Bld 196 (*)    All other components within normal limits    EKG None  Radiology DG Ribs Unilateral W/Chest Right  Result Date: 08/26/2021 CLINICAL DATA:  Right rib pain after falling off a roof yesterday. EXAM: RIGHT RIBS AND CHEST -  3+ VIEW COMPARISON:  None. FINDINGS: Normal sized heart. Clear lungs. Mild peribronchial thickening. No rib fracture or pneumothorax. Small rounded metallic density suggesting a small pellet overlying the upper L4 vertebral body on the left. IMPRESSION: No acute abnormality. Electronically Signed   By: Beckie Salts M.D.   On: 08/26/2021 13:37   DG Shoulder Right  Result Date: 08/26/2021 CLINICAL DATA:  Right shoulder pain after falling off a roof yesterday. EXAM: RIGHT SHOULDER - 2+ VIEW COMPARISON:  None. FINDINGS: Mild greater tuberosity hyperostosis. No fracture or dislocation seen. IMPRESSION: No fracture or dislocation. Electronically Signed   By: Beckie Salts M.D.   On: 08/26/2021 13:38   DG Elbow Complete Left  Result Date: 08/26/2021 CLINICAL DATA:  Left arm pain following a fall off a roof yesterday. EXAM: LEFT ELBOW - COMPLETE 3+ VIEW COMPARISON:  None. FINDINGS: Corticated periosteal ossification and adjacent corticated soft tissue calcifications compatible with an old injury. No acute fracture or dislocation. No effusion. Mild coronoid process spur formation. IMPRESSION: No acute fracture or dislocation. Electronically Signed   By: Beckie Salts M.D.   On: 08/26/2021 13:35   DG Wrist Complete Left  Result Date: 08/26/2021 CLINICAL DATA:  Left wrist pain after falling off a roof yesterday. EXAM: LEFT WRIST - COMPLETE 3+ VIEW COMPARISON:   Left hand radiographs obtained at the same time. FINDINGS: There is no evidence of fracture or dislocation. There is no evidence of arthropathy or other focal bone abnormality. Soft tissues are unremarkable. IMPRESSION: Normal examination. Electronically Signed   By: Beckie Salts M.D.   On: 08/26/2021 13:39   CT HEAD WO CONTRAST ( )  Result Date: 08/26/2021 CLINICAL DATA:  Larey Seat 15 feet off a roof yesterday. EXAM: CT HEAD WITHOUT CONTRAST TECHNIQUE: Contiguous axial images were obtained from the base of the skull through the vertex without intravenous contrast. COMPARISON:  None. FINDINGS: Brain: No evidence of acute infarction, hemorrhage, hydrocephalus, extra-axial collection or mass lesion/mass effect. Vascular: No hyperdense vessel or unexpected calcification. Skull: Normal. Negative for fracture or focal lesion. Sinuses/Orbits: No acute finding. Other: None. IMPRESSION: 1. Normal noncontrast head CT. Electronically Signed   By: Obie Dredge M.D.   On: 08/26/2021 18:21   CT Chest W Contrast  Result Date: 08/26/2021 CLINICAL DATA:  Larey Seat off roof yesterday, bilateral upper extremity pain EXAM: CT CHEST WITH CONTRAST TECHNIQUE: Multidetector CT imaging of the chest was performed during intravenous contrast administration. CONTRAST:  OMNIPAQUE IOHEXOL 350 MG/ML SOLN COMPARISON:  08/26/2021 FINDINGS: Cardiovascular: The heart and great vessels are unremarkable without pericardial effusion. No evidence of vascular injury. Normal caliber of the thoracic aorta without aneurysm or dissection. Mediastinum/Nodes: No enlarged mediastinal, hilar, or axillary lymph nodes. Thyroid gland, trachea, and esophagus demonstrate no significant findings. Lungs/Pleura: No acute airspace disease, effusion, or pneumothorax. Central airways are patent. Upper Abdomen: There is diffuse hepatic steatosis. No acute upper abdominal finding. Musculoskeletal: There are no acute displaced fractures. Reconstructed images  demonstrate no additional findings. IMPRESSION: 1. No acute intrathoracic trauma. 2. Hepatic steatosis. Electronically Signed   By: Sharlet Salina M.D.   On: 08/26/2021 18:16   CT Cervical Spine Wo Contrast  Result Date: 08/26/2021 CLINICAL DATA:  Neck trauma, dangerous mechanism by report in a 40 year old male. Neck pain after fall. EXAM: CT CERVICAL SPINE WITHOUT CONTRAST TECHNIQUE: Multidetector CT imaging of the cervical spine was performed without intravenous contrast. Multiplanar CT image reconstructions were also generated. COMPARISON:  None FINDINGS: Alignment: Mild reversal of normal cervical lordotic curvature and  straightening, likely related to patient position. No sign of acute fracture. Skull base and vertebrae: No acute fracture. No primary bone lesion or focal pathologic process. Soft tissues and spinal canal: No prevertebral fluid or swelling. No visible canal hematoma. Disc levels:  Vertebral body heights and disc spaces are maintained. Upper chest: Negative. Other: None. IMPRESSION: No acute fracture or static subluxation of the cervical spine. Mild reversal of normal cervical lordotic curvature and straightening, likely related to patient position. Electronically Signed   By: Donzetta Kohut M.D.   On: 08/26/2021 14:53   DG Shoulder Left  Result Date: 08/26/2021 CLINICAL DATA:  Left arm and chest pain following a fall off a roof yesterday. EXAM: LEFT SHOULDER - 2+ VIEW COMPARISON:  None. FINDINGS: There is no evidence of fracture or dislocation. There is no evidence of arthropathy or other focal bone abnormality. Soft tissues are unremarkable. IMPRESSION: Normal examination. Electronically Signed   By: Beckie Salts M.D.   On: 08/26/2021 13:32   DG Hand Complete Left  Result Date: 08/26/2021 CLINICAL DATA:  Left hand pain after falling off a roof yesterday. EXAM: LEFT HAND - COMPLETE 3+ VIEW COMPARISON:  None. FINDINGS: There is no evidence of fracture or dislocation. There is no  evidence of arthropathy or other focal bone abnormality. Soft tissues are unremarkable. IMPRESSION: Normal examination. Electronically Signed   By: Beckie Salts M.D.   On: 08/26/2021 13:35    Procedures Procedures   Medications Ordered in ED Medications  HYDROcodone-acetaminophen (NORCO/VICODIN) 5-325 MG per tablet 1 tablet (1 tablet Oral Given 08/26/21 1737)  iohexol (OMNIPAQUE) 350 MG/ML injection 100 mL (100 mLs Intravenous Contrast Given 08/26/21 1801)    ED Course  I have reviewed the triage vital signs and the nursing notes.  Pertinent labs & imaging results that were available during my care of the patient were reviewed by me and considered in my medical decision making (see chart for details).    MDM Rules/Calculators/A&P                          Pt is a 40 y.o. male who is right-hand dominant the presents to the emergency department due to a fall from a roof that occurred yesterday.  Labs: I-STAT Chem-8's with a glucose of 196.  Imaging: X-rays obtained of the right ribs, chest, shoulders, left elbow, left wrist, and left hand, with additional CT scans of the head, cervical spine, and chest.  I, Placido Sou, PA-C, personally reviewed and evaluated these images and lab results as part of my medical decision-making.  Patient underwent significant imaging with findings as noted above.  All imaging is reassuring.  Neurovascularly intact in all 4 extremities.  Ambulatory with a steady gait.  No red flags.  Feel the patient is stable for discharge at this time.  Will discharge on a course of Flexeril.  We discussed safety regarding this medication.  Patient given a referral to orthopedics and recommended that they reach out to them to schedule a follow-up appointment.  We will place patient in a sling for comfort.  We discussed return precautions.  His questions were answered and he was amicable at the time of discharge.  Note: Portions of this report may have been transcribed  using voice recognition software. Every effort was made to ensure accuracy; however, inadvertent computerized transcription errors may be present.   Final Clinical Impression(s) / ED Diagnoses Final diagnoses:  Fall, initial encounter  Acute pain of  both shoulders  Left elbow pain  Upper back pain   Rx / DC Orders ED Discharge Orders          Ordered    cyclobenzaprine (FLEXERIL) 10 MG tablet  At bedtime PRN        08/26/21 1837             Placido SouJoldersma, Rozann Holts, PA-C 08/26/21 1850    Terald Sleeperrifan, Matthew J, MD 08/27/21 [phone]

## 2021-08-26 NOTE — ED Provider Notes (Signed)
Emergency Medicine Provider Triage Evaluation Note  Alan Vaughn , a 40 y.o. male  was evaluated in triage.  Pt complains of left shoulder, forearm, elbow, wrist, and hand pain as well as right shoulder pain and neck pain after fall from 15 feet height from a roof while at work yesterday onto his left side.  Denies head trauma or LOC, nausea, vomiting, blurry or double vision since that time.  Patient did not present for evaluation yesterday as he was not hurting..  Review of Systems  Positive: Neck pain, left upper extremity pain and right shoulder pain Negative: Blurry vision, double vision, nausea, vomiting  Physical Exam  BP 128/77   Pulse 63   Temp 98.6 F (37 C) (Oral)   Resp 18   SpO2 100%  Gen:   Awake, no distress   Resp:  Normal effort  MSK:   Moves extremities without difficulty  Other:  Tenderness palpation of the entirety of the left upper extremity primarily in the shoulder with some deformity concerning for possible dislocation.  Abrasion and tenderness palpation over the right anterior shoulder and chest wall.  Tenderness palpation over the cervical spine with full range of motion actively with minimal pain.  Medical Decision Making  Medically screening exam initiated at 11:59 AM.  Appropriate orders placed.  Alan Vaughn was informed that the remainder of the evaluation will be completed by another provider, this initial triage assessment does not replace that evaluation, and the importance of remaining in the ED until their evaluation is complete.  This chart was dictated using voice recognition software, Dragon. Despite the best efforts of this provider to proofread and correct errors, errors may still occur which can change documentation meaning.    Paris Lore, PA-C 08/26/21 1210    Maia Plan, MD 09/03/21 1743

## 2021-08-26 NOTE — Discharge Instructions (Signed)
I recommend a combination of tylenol and ibuprofen for management of your pain. You can take a low dose of both at the same time. I recommend 500 mg of Tylenol combined with 600 mg of ibuprofen. This is one maximum strength Tylenol and three regular ibuprofen. You can take these 2-3 times for day for your pain. Please try to take these medications with a small amount of food as well to prevent upsetting your stomach.  Also, please consider topical pain relieving creams such as Voltaran Gel, BioFreeze, or Icy Hot. There is also a pain relieving cream made by [employer]. You should be able to find all of these at your local pharmacy.   I am prescribing you a strong muscle relaxer called flexeril. Please only take this medication once in the evening with dinner. This medication can make you quite drowsy. Do not mix it with alcohol. Do not drive a vehicle after taking it.   Below is the contact information for Dr. Aundria Rud.  He is a local orthopedic specialist.  Please give them a call and schedule an appointment for reevaluation of your injuries.  If you develop any new or worsening symptoms please come back to the emergency department.  It was a pleasure to meet you.

## 2022-06-17 IMAGING — DX DG RIBS W/ CHEST 3+V*R*
5 series · 5 of 5 positions shown · non-contrast
Comparison: None.

CLINICAL DATA: Right rib pain after falling off a roof yesterday.

EXAM:
RIGHT RIBS AND CHEST - 3+ VIEW

[chest pa]
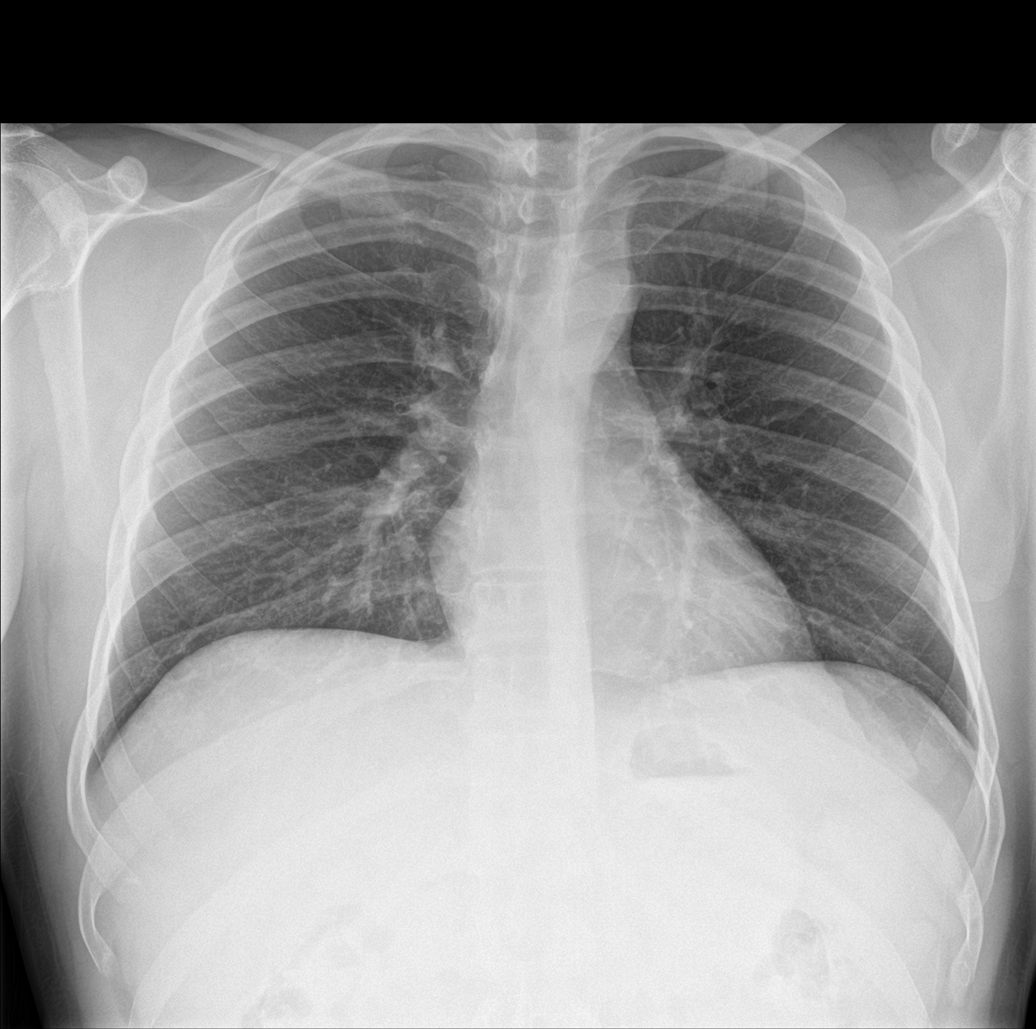

[rib ap (1 of 2)]
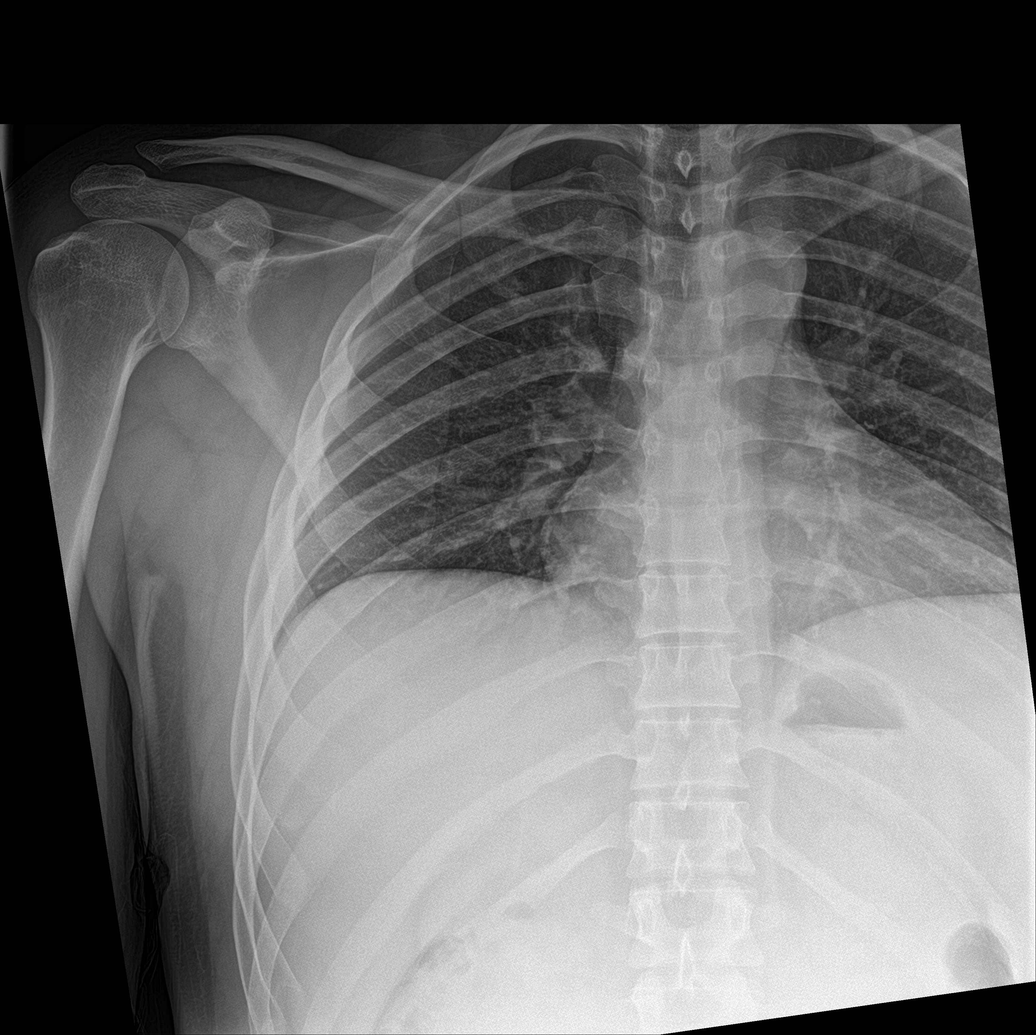

[rib ap (2 of 2)]
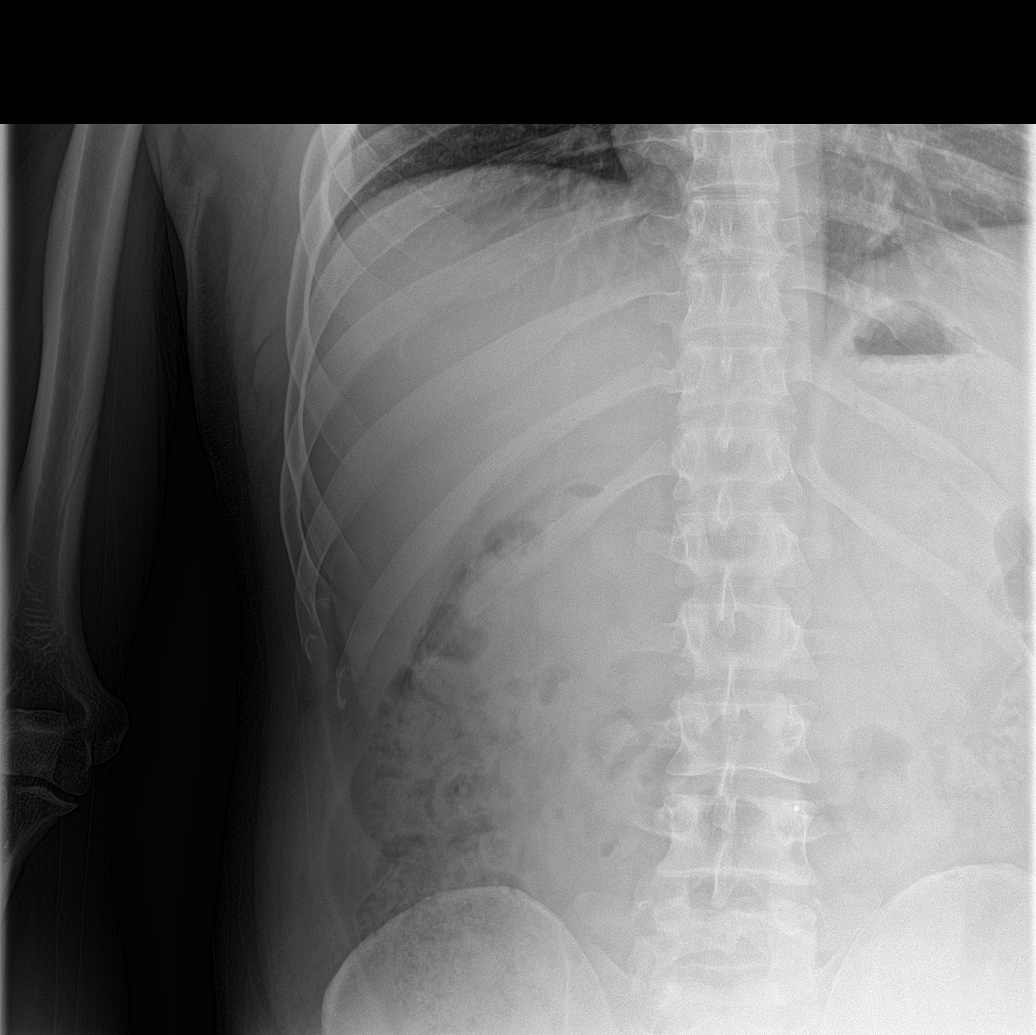

[rib ap obl (1 of 2)]
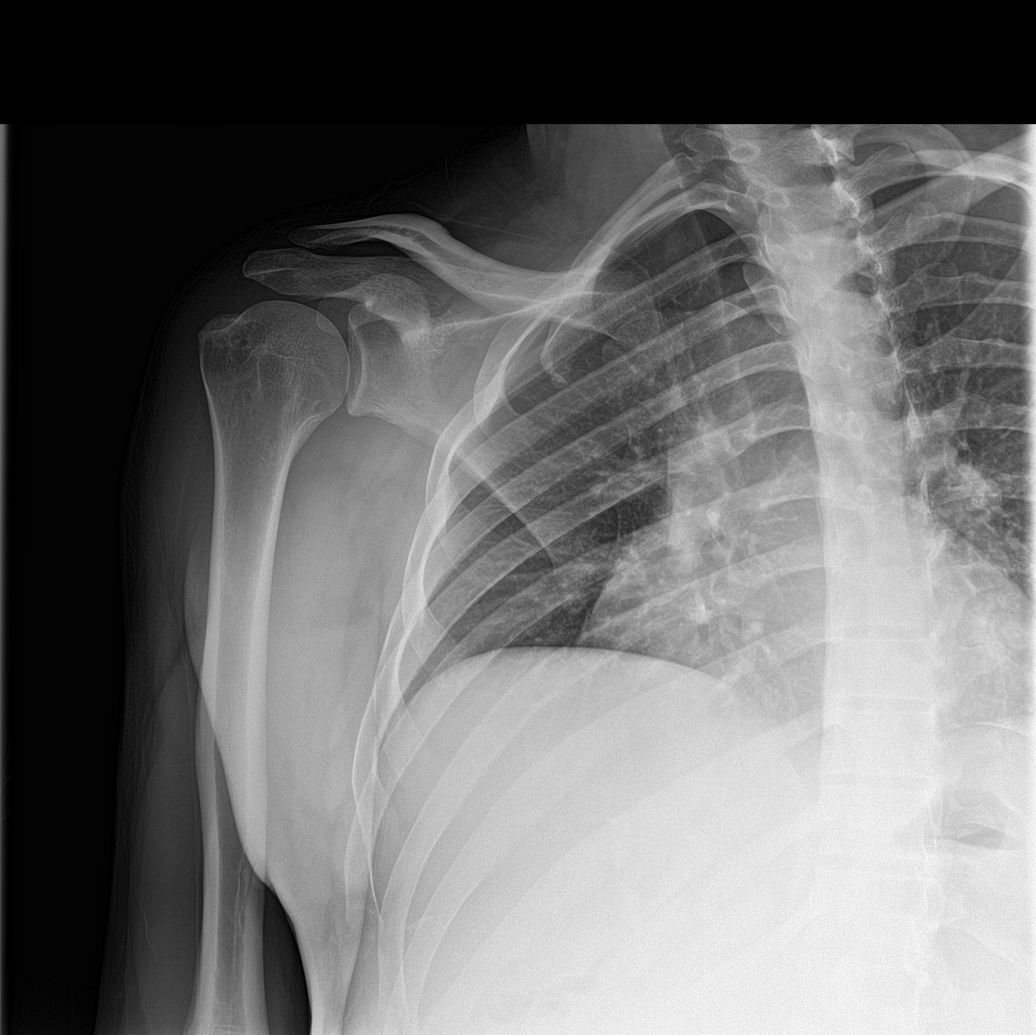

[rib ap obl (2 of 2)]
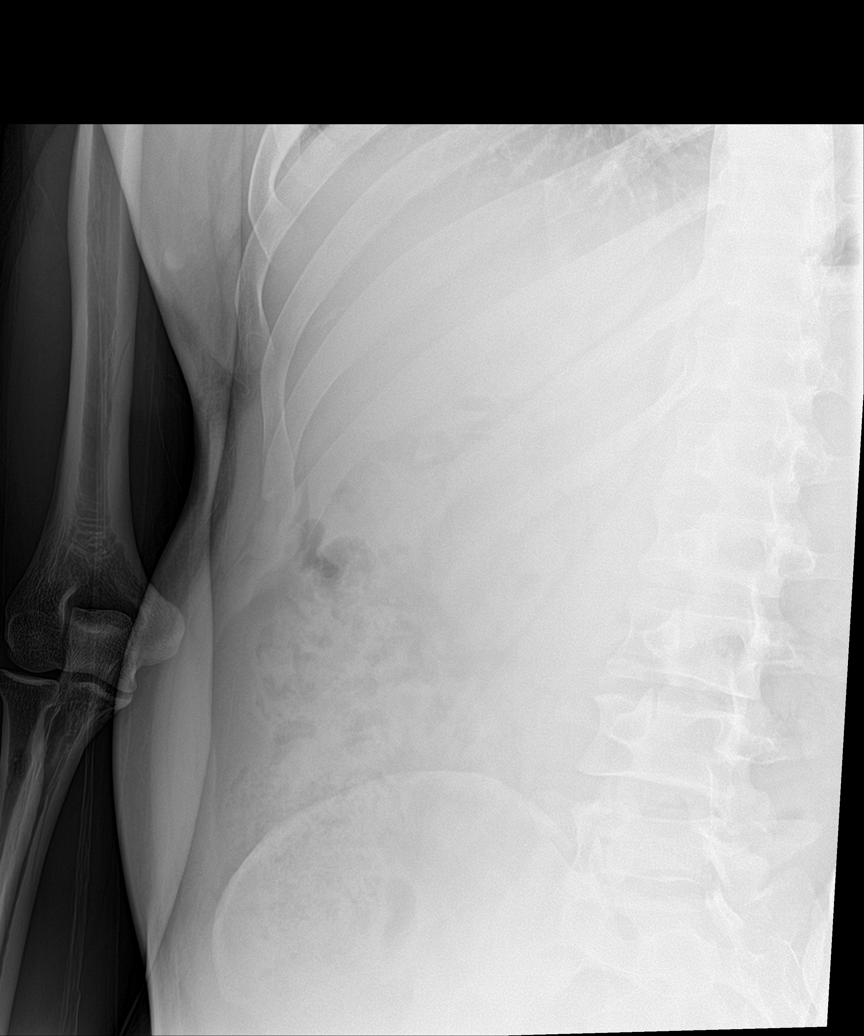

[5 of 5 positions shown; findings below may reference images not displayed]

FINDINGS: Normal sized heart. Clear lungs. Mild peribronchial thickening. No
rib fracture or pneumothorax. Small rounded metallic density
suggesting a small pellet overlying the upper L4 vertebral body on
the left.
IMPRESSION: No acute abnormality.

## 2022-09-20 ENCOUNTER — Emergency Department (HOSPITAL_COMMUNITY): Payer: Self-pay

## 2022-09-20 ENCOUNTER — Emergency Department (HOSPITAL_COMMUNITY)
Admission: EM | Admit: 2022-09-20 | Discharge: 2022-09-20 | Disposition: A | Discharge status: Home / Self Care | Payer: Self-pay | Attending: Emergency Medicine | Admitting: Emergency Medicine

## 2022-09-20 DIAGNOSIS — N23 Unspecified renal colic: Secondary | ICD-10-CM | POA: Insufficient documentation

## 2022-09-20 DIAGNOSIS — R739 Hyperglycemia, unspecified: Secondary | ICD-10-CM

## 2022-09-20 DIAGNOSIS — R7309 Other abnormal glucose: Secondary | ICD-10-CM | POA: Insufficient documentation

## 2022-09-20 LAB — I-STAT CHEM 8, ED
BUN: 19 mg/dL (ref 6–20)
Calcium, Ion: 1.13 mmol/L — ABNORMAL LOW (ref 1.15–1.40)
Chloride: 104 mmol/L (ref 98–111)
Creatinine, Ser: 1 mg/dL (ref 0.61–1.24)
Glucose, Bld: 324 mg/dL — ABNORMAL HIGH (ref 70–99)
HCT: 46 % (ref 39.0–52.0)
Hemoglobin: 15.6 g/dL (ref 13.0–17.0)
Potassium: 4.3 mmol/L (ref 3.5–5.1)
Sodium: 138 mmol/L (ref 135–145)
TCO2: 22 mmol/L (ref 22–32)

## 2022-09-20 LAB — CBC WITH DIFFERENTIAL/PLATELET
Abs Immature Granulocytes: 0.03 10*3/uL (ref 0.00–0.07)
Basophils Absolute: 0.1 10*3/uL (ref 0.0–0.1)
Basophils Relative: 1 %
Eosinophils Absolute: 0.1 10*3/uL (ref 0.0–0.5)
Eosinophils Relative: 1 %
HCT: 47 % (ref 39.0–52.0)
Hemoglobin: 15.8 g/dL (ref 13.0–17.0)
Immature Granulocytes: 0 %
Lymphocytes Relative: 24 %
Lymphs Abs: 2 10*3/uL (ref 0.7–4.0)
MCH: 27 pg (ref 26.0–34.0)
MCHC: 33.6 g/dL (ref 30.0–36.0)
MCV: 80.2 fL (ref 80.0–100.0)
Monocytes Absolute: 0.4 10*3/uL (ref 0.1–1.0)
Monocytes Relative: 5 %
Neutro Abs: 5.9 10*3/uL (ref 1.7–7.7)
Neutrophils Relative %: 69 %
Platelets: 173 10*3/uL (ref 150–400)
RBC: 5.86 MIL/uL — ABNORMAL HIGH (ref 4.22–5.81)
RDW: 13.2 % (ref 11.5–15.5)
WBC: 8.5 10*3/uL (ref 4.0–10.5)
nRBC: 0 % (ref 0.0–0.2)

## 2022-09-20 LAB — BASIC METABOLIC PANEL
Anion gap: 10 (ref 5–15)
BUN: 16 mg/dL (ref 6–20)
CO2: 23 mmol/L (ref 22–32)
Calcium: 9.2 mg/dL (ref 8.9–10.3)
Chloride: 102 mmol/L (ref 98–111)
Creatinine, Ser: 1.06 mg/dL (ref 0.61–1.24)
GFR, Estimated: >60 mL/min (ref >60)
Glucose, Bld: 337 mg/dL — ABNORMAL HIGH (ref 70–99)
Potassium: 4.1 mmol/L (ref 3.5–5.1)
Sodium: 135 mmol/L (ref 135–145)

## 2022-09-20 LAB — HEMOGLOBIN A1C
Hgb A1c MFr Bld: 8.3 % — ABNORMAL HIGH (ref 4.8–5.6)
Mean Plasma Glucose: 191.51 mg/dL

## 2022-09-20 MED ORDER — HYDROCODONE-ACETAMINOPHEN 5-325 MG PO TABS: 2.0000 | ORAL_TABLET | Freq: Four times a day (QID) | ORAL | 0 refills | Status: DC | PRN

## 2022-09-20 MED ORDER — ONDANSETRON 8 MG PO TBDP: 8.0000 mg | ORAL_TABLET | Freq: Three times a day (TID) | ORAL | 0 refills | Status: DC | PRN

## 2022-09-20 MED ORDER — ONDANSETRON HCL 4 MG/2ML IJ SOLN
4.0000 mg | Freq: Once | INTRAMUSCULAR | Status: AC
  Administered 2022-09-20: 4 mg via INTRAVENOUS
  Filled 2022-09-20: qty 2

## 2022-09-20 MED ORDER — OXYCODONE-ACETAMINOPHEN 5-325 MG PO TABS
1.0000 | ORAL_TABLET | Freq: Once | ORAL | Status: AC
  Administered 2022-09-20: 1 via ORAL
  Filled 2022-09-20: qty 1

## 2022-09-20 MED ORDER — ONDANSETRON 4 MG PO TBDP: 4.0000 mg | ORAL_TABLET | Freq: Once | ORAL | Status: DC

## 2022-09-20 MED ORDER — IBUPROFEN 600 MG PO TABS: 600.0000 mg | ORAL_TABLET | Freq: Four times a day (QID) | ORAL | 0 refills | Status: DC | PRN

## 2022-09-20 MED ORDER — MORPHINE SULFATE (PF) 2 MG/ML IV SOLN
4.0000 mg | Freq: Once | INTRAVENOUS | Status: AC
  Administered 2022-09-20: 4 mg via INTRAVENOUS
  Filled 2022-09-20: qty 2

## 2022-09-20 MED ORDER — KETOROLAC TROMETHAMINE 30 MG/ML IJ SOLN
15.0000 mg | Freq: Once | INTRAMUSCULAR | Status: AC
  Administered 2022-09-20: 15 mg via INTRAVENOUS
  Filled 2022-09-20: qty 1

## 2022-09-20 MED ORDER — KETOROLAC TROMETHAMINE 30 MG/ML IJ SOLN
30.0000 mg | Freq: Once | INTRAMUSCULAR | Status: AC
  Administered 2022-09-20: 30 mg via INTRAVENOUS
  Filled 2022-09-20: qty 1

## 2022-09-20 MED ORDER — ACETAMINOPHEN 500 MG PO TABS: 500.0000 mg | ORAL_TABLET | Freq: Four times a day (QID) | ORAL | 0 refills | Status: DC | PRN

## 2022-09-20 NOTE — Discharge Instructions (Addendum)
We saw you in the ER for the abdominal pain. Our results indicate that you have a kidney stone. We were able to get your pain is relative control, and we can safely send you home.  Take the meds prescribed. Set up an appointment with the Urologist. If the pain is unbearable, you start having fevers, chills, and are unable to keep any meds down - then return to the ER.  Additionally, your blood sugar is noted to be elevated.  It is unclear if you have new onset diabetes or the blood sugar is elevated because of stress from the kidney stone.  Please call [facility] for follow-up.  We also recommend that he check your blood sugar first thing in the morning and right before you go to bed and document that in a piece of paper for the next 3 to 5 days -and take that information to the [facility] wellness team.  Return to the ER for blood sugars over 500.

## 2022-09-20 NOTE — ED Provider Notes (Signed)
[facility] EMERGENCY DEPARTMENT Provider Note   CSN: [id number] Arrival date & time: 09/20/22  0920     History  Chief Complaint  Patient presents with   Emesis   Nausea   Abdominal Pain   Back Pain    Alan Vaughn is a 41 y.o. male.  HPI     41 year old patient comes in with chief complaint of flank pain. Patient has history of kidney stones.  Indicates that he started having sudden onset left-sided abdominal pain this morning around 9:00.  Pain is located in the left flank region and in his left groin area.  He had associated nausea, vomiting.  Patient had a kidney stone several years ago, pain is similar.  He denies any burning with urination, blood in the urine.  Patient's blood sugar is noted to be elevated.  No history of diabetes.  Patient does not have a PCP or insurance.   Home Medications Prior to Admission medications   Medication Sig Start Date End Date Taking? Authorizing Provider  acetaminophen (TYLENOL) 500 MG tablet Take 1 tablet (500 mg total) by mouth every 6 (six) hours as needed. 09/20/22  Yes Derwood Kaplan, MD  HYDROcodone-acetaminophen (NORCO/VICODIN) 5-325 MG tablet Take 2 tablets by mouth every 6 (six) hours as needed. 09/20/22  Yes Derwood Kaplan, MD  ibuprofen (ADVIL) 600 MG tablet Take 1 tablet (600 mg total) by mouth every 6 (six) hours as needed. 09/20/22  Yes Tavari Loadholt, MD  ondansetron (ZOFRAN-ODT) 8 MG disintegrating tablet Take 1 tablet (8 mg total) by mouth every 8 (eight) hours as needed for nausea. 09/20/22  Yes Kito Cuffe, MD  cephALEXin (KEFLEX) 500 MG capsule Take 1 capsule (500 mg total) by mouth 4 (four) times daily. Patient not taking: Reported on 09/20/2022 12/01/16   Antony Madura, PA-C  cyclobenzaprine (FLEXERIL) 10 MG tablet Take 1 tablet (10 mg total) by mouth at bedtime as needed for muscle spasms. Patient not taking: Reported on 09/20/2022 08/26/21   Placido Sou, PA-C  doxycycline (VIBRAMYCIN)  100 MG capsule Take 1 capsule (100 mg total) by mouth 2 (two) times daily. Patient not taking: Reported on 09/20/2022 09/14/18   Horton, Mayer Masker, MD  nystatin cream (MYCOSTATIN) Apply to foreskin 2 times daily Patient not taking: Reported on 09/20/2022 12/01/16   Antony Madura, PA-C  tamsulosin (FLOMAX) 0.4 MG CAPS capsule Take 1 capsule (0.4 mg total) by mouth daily. Patient not taking: Reported on 09/20/2022 12/20/14   Gerhard Munch, MD      Allergies    Patient has no known allergies.    Review of Systems   Review of Systems  All other systems reviewed and are negative.   Physical Exam Updated Vital Signs BP 114/85 (BP Location: Left Arm)   Pulse 91   Temp 98.4 F (36.9 C) (Oral)   Resp 18   Wt 91 kg   SpO2 97%  Physical Exam Vitals and nursing note reviewed.  Constitutional:      Appearance: He is well-developed.  HENT:     Head: Atraumatic.  Cardiovascular:     Rate and Rhythm: Normal rate.  Pulmonary:     Effort: Pulmonary effort is normal.  Abdominal:     Tenderness: There is no abdominal tenderness.  Musculoskeletal:     Cervical back: Neck supple.  Skin:    General: Skin is warm.  Neurological:     Mental Status: He is alert and oriented to person, place, and time.  ED Results / Procedures / Treatments   Labs (all labs ordered are listed, but only abnormal results are displayed) Labs Reviewed  BASIC METABOLIC PANEL - Abnormal; Notable for the following components:      Result Value   Glucose, Bld 337 (*)    All other components within normal limits  CBC WITH DIFFERENTIAL/PLATELET - Abnormal; Notable for the following components:   RBC 5.86 (*)    All other components within normal limits  I-STAT CHEM 8, ED - Abnormal; Notable for the following components:   Glucose, Bld 324 (*)    Calcium, Ion 1.13 (*)    All other components within normal limits  URINALYSIS, ROUTINE W REFLEX MICROSCOPIC  HEMOGLOBIN A1C    EKG None  Radiology CT Renal  Stone Study  Result Date: 09/20/2022 CLINICAL DATA:  Pt complains of left flank pain. History of renal stones. States the pain started this morning. It is severe. Radiates to the left groin. Has associated diaphoresis and vomiting. Flank pain, kidney stone suspected EXAM: CT ABDOMEN AND PELVIS WITHOUT CONTRAST TECHNIQUE: Multidetector CT imaging of the abdomen and pelvis was performed following the standard protocol without IV contrast. RADIATION DOSE REDUCTION: This exam was performed according to the departmental dose-optimization program which includes automated exposure control, adjustment of the mA and/or kV according to patient size and/or use of iterative reconstruction technique. COMPARISON:  None Available. FINDINGS: Lower chest: Lung bases are clear. Hepatobiliary: No focal hepatic lesion. No biliary duct dilatation. Common bile duct is normal. Pancreas: Pancreas is normal. No ductal dilatation. No pancreatic inflammation. Spleen: Normal spleen Adrenals/urinary tract: Adrenal gland normal. Perinephric stranding on the LEFT. Obstructing calculus in the proximal LEFT ureter measures 4 mm on image 50/3. The proximal LEFT ureteral calculus is at the L3 vertebral body level. No nephrolithiasis in LEFT or RIGHT.  No bladder calculi Stomach/Bowel: Stomach, small bowel, appendix, and cecum are normal. The colon and rectosigmoid colon are normal. Vascular/Lymphatic: Abdominal aorta is normal caliber. No periportal or retroperitoneal adenopathy. No pelvic adenopathy. Reproductive: Unremarkable Other: No free fluid. Musculoskeletal: No aggressive osseous lesion. IMPRESSION: 1. Obstructing calculus in the proximal LEFT ureter. 2. No nephrolithiasis. Electronically Signed   By: Suzy Bouchard M.D.   On: 09/20/2022 11:22    Procedures Procedures    Medications Ordered in ED Medications  ondansetron (ZOFRAN-ODT) disintegrating tablet 4 mg (has no administration in time range)  ondansetron (ZOFRAN) injection  4 mg (4 mg Intravenous Given 09/20/22 0948)  morphine (PF) 2 MG/ML injection 4 mg (4 mg Intravenous Given 09/20/22 0947)  ketorolac (TORADOL) 30 MG/ML injection 30 mg (30 mg Intravenous Given 09/20/22 1017)  ketorolac (TORADOL) 30 MG/ML injection 15 mg (15 mg Intravenous Given 09/20/22 1442)  oxyCODONE-acetaminophen (PERCOCET/ROXICET) 5-325 MG per tablet 1 tablet (1 tablet Oral Given 09/20/22 1506)  ondansetron (ZOFRAN) injection 4 mg (4 mg Intravenous Given 09/20/22 1513)    ED Course/ Medical Decision Making/ A&P                           Medical Decision Making Amount and/or Complexity of Data Reviewed Labs: ordered.  Risk OTC drugs. Prescription drug management.   This patient presents to the ED with chief complaint(s) of left-sided flank pain, lower quadrant abdominal pain with pertinent past medical history of kidney stone.The complaint involves an extensive differential diagnosis and also carries with it a high risk of complications and morbidity.    The differential diagnosis includes : Acute  diverticulitis, kidney stone, pyelonephritis, cystitis, hernia.  Patient had CT renal stone.  Confirms 4 mm stone.  Patient's pain controlled after Toradol.  Patient found to elevated blood sugar.  There is no evidence of DKA however. Unsure of the hyperglycemia is new onset diabetes or stress mediated elevated sugar from the kidney stone.  -The bedside.  She actually has diabetes.  Advised her to check the blood sugar forcing in the morning (fasting blood sugar) and again at nighttime for the next 5 days and I will also get hemoglobin A1c.  Community wellness follow-up provided.   Additional history obtained: Additional history obtained from family   Independent labs interpretation:  The following labs were independently interpreted: Elevated blood sugar-  not fasting.  Independent visualization and interpretation of imaging: - I independently visualized the following imaging with  scope of interpretation limited to determining acute life threatening conditions related to emergency care: CT renal stone, which revealed hydronephrosis left side  Treatment and Reassessment: Pain and relative control after initial Toradol.  We will give him repeat Toradol, Zofran.  Stable for discharge.  Strict return precautions discussed.   Final Clinical Impression(s) / ED Diagnoses Final diagnoses:  Ureteral colic  Elevated blood sugar    Rx / DC Orders ED Discharge Orders          Ordered    HYDROcodone-acetaminophen (NORCO/VICODIN) 5-325 MG tablet  Every 6 hours PRN        09/20/22 1509    acetaminophen (TYLENOL) 500 MG tablet  Every 6 hours PRN        09/20/22 1509    ibuprofen (ADVIL) 600 MG tablet  Every 6 hours PRN        09/20/22 1509    ondansetron (ZOFRAN-ODT) 8 MG disintegrating tablet  Every 8 hours PRN        09/20/22 1511              Varney Biles, MD 09/20/22 1517

## 2022-09-20 NOTE — ED Triage Notes (Signed)
EMS stated, pt has left side abdominal pain started this morning, in route started having N/V.

## 2022-09-20 NOTE — ED Provider Triage Note (Signed)
Emergency Medicine Provider Triage Evaluation Note  Grayton Sahagun , a 41 y.o. male  was evaluated in triage.  Pt complains of left flank pain.  History of renal stones.  States the pain started this morning.  It is severe.  Radiates to the left groin.  Has associated diaphoresis and vomiting.   Review of Systems  Positive: As above Negative: Fevers  Physical Exam  BP (!) 150/94 (BP Location: Right Arm)   Pulse 74   Temp 98.2 F (36.8 C) (Oral)   Resp 20   Wt 91 kg   SpO2 100%  Gen:   Awake, no distress   Resp:  Normal effort  MSK:   Moves extremities without difficulty  Other:  Left CVA tenderness with tenderness to the left abdomen  Medical Decision Making  Medically screening exam initiated at 10:04 AM.  Appropriate orders placed.  Chibuike Lampkins was informed that the remainder of the evaluation will be completed by another provider, this initial triage assessment does not replace that evaluation, and the importance of remaining in the ED until their evaluation is complete.  IV pain and nausea control was initiated on triage.  Labs and stone study ordered   Roylene Reason, Hershal Coria 09/20/22 1005

## 2022-09-20 NOTE — ED Notes (Signed)
Pt pulled his IV line out on left Ac.  New line for rt. Ac

## 2022-09-23 ENCOUNTER — Encounter (HOSPITAL_COMMUNITY): Payer: Self-pay

## 2022-09-23 ENCOUNTER — Emergency Department (HOSPITAL_COMMUNITY)
Admission: EM | Admit: 2022-09-23 | Discharge: 2022-09-23 | Disposition: A | Discharge status: Home / Self Care | Payer: Self-pay | Attending: Emergency Medicine | Admitting: Emergency Medicine

## 2022-09-23 ENCOUNTER — Emergency Department (HOSPITAL_COMMUNITY): Payer: Self-pay

## 2022-09-23 ENCOUNTER — Other Ambulatory Visit: Payer: Self-pay

## 2022-09-23 DIAGNOSIS — N201 Calculus of ureter: Secondary | ICD-10-CM

## 2022-09-23 DIAGNOSIS — R7989 Other specified abnormal findings of blood chemistry: Secondary | ICD-10-CM | POA: Insufficient documentation

## 2022-09-23 DIAGNOSIS — N132 Hydronephrosis with renal and ureteral calculous obstruction: Secondary | ICD-10-CM | POA: Insufficient documentation

## 2022-09-23 DIAGNOSIS — R739 Hyperglycemia, unspecified: Secondary | ICD-10-CM | POA: Insufficient documentation

## 2022-09-23 LAB — URINALYSIS, ROUTINE W REFLEX MICROSCOPIC
Bacteria, UA: NONE SEEN
Bilirubin Urine: NEGATIVE
Glucose, UA: >=500 mg/dL — AB
Ketones, ur: NEGATIVE mg/dL
Leukocytes,Ua: NEGATIVE
Nitrite: NEGATIVE
Protein, ur: NEGATIVE mg/dL
Specific Gravity, Urine: 1.015 (ref 1.005–1.030)
pH: 5 (ref 5.0–8.0)

## 2022-09-23 LAB — CBC
HCT: 40.4 % (ref 39.0–52.0)
Hemoglobin: 14.2 g/dL (ref 13.0–17.0)
MCH: 27.8 pg (ref 26.0–34.0)
MCHC: 35.1 g/dL (ref 30.0–36.0)
MCV: 79.1 fL — ABNORMAL LOW (ref 80.0–100.0)
Platelets: 153 10*3/uL (ref 150–400)
RBC: 5.11 MIL/uL (ref 4.22–5.81)
RDW: 13 % (ref 11.5–15.5)
WBC: 12.6 10*3/uL — ABNORMAL HIGH (ref 4.0–10.5)
nRBC: 0 % (ref 0.0–0.2)

## 2022-09-23 LAB — COMPREHENSIVE METABOLIC PANEL
ALT: 31 U/L (ref 0–44)
AST: 20 U/L (ref 15–41)
Albumin: 3.8 g/dL (ref 3.5–5.0)
Alkaline Phosphatase: 80 U/L (ref 38–126)
Anion gap: 10 (ref 5–15)
BUN: 30 mg/dL — ABNORMAL HIGH (ref 6–20)
CO2: 24 mmol/L (ref 22–32)
Calcium: 9.4 mg/dL (ref 8.9–10.3)
Chloride: 103 mmol/L (ref 98–111)
Creatinine, Ser: 1.91 mg/dL — ABNORMAL HIGH (ref 0.61–1.24)
GFR, Estimated: 45 mL/min — ABNORMAL LOW (ref >60)
Glucose, Bld: 268 mg/dL — ABNORMAL HIGH (ref 70–99)
Potassium: 4.1 mmol/L (ref 3.5–5.1)
Sodium: 137 mmol/L (ref 135–145)
Total Bilirubin: 1.8 mg/dL — ABNORMAL HIGH (ref 0.3–1.2)
Total Protein: 7.1 g/dL (ref 6.5–8.1)

## 2022-09-23 MED ORDER — HYDROCODONE-ACETAMINOPHEN 5-325 MG PO TABS: 2.0000 | ORAL_TABLET | ORAL | 0 refills | Status: DC | PRN

## 2022-09-23 MED ORDER — METOCLOPRAMIDE HCL 5 MG/ML IJ SOLN
10.0000 mg | Freq: Once | INTRAMUSCULAR | Status: AC
  Administered 2022-09-23: 10 mg via INTRAVENOUS
  Filled 2022-09-23: qty 2

## 2022-09-23 MED ORDER — LORAZEPAM 2 MG/ML IJ SOLN
INTRAMUSCULAR | Status: AC
  Filled 2022-09-23: qty 1

## 2022-09-23 MED ORDER — DIPHENHYDRAMINE HCL 25 MG PO CAPS
25.0000 mg | ORAL_CAPSULE | Freq: Once | ORAL | Status: AC
  Administered 2022-09-23: 25 mg via ORAL
  Filled 2022-09-23: qty 1

## 2022-09-23 MED ORDER — TAMSULOSIN HCL 0.4 MG PO CAPS: 0.4000 mg | ORAL_CAPSULE | Freq: Every day | ORAL | 0 refills | Status: DC

## 2022-09-23 MED ORDER — LACTATED RINGERS IV BOLUS
1000.0000 mL | Freq: Once | INTRAVENOUS | Status: AC
  Administered 2022-09-23: 1000 mL via INTRAVENOUS

## 2022-09-23 MED ORDER — HYDROCODONE-ACETAMINOPHEN 5-325 MG PO TABS
1.0000 | ORAL_TABLET | Freq: Once | ORAL | Status: AC
  Administered 2022-09-23: 1 via ORAL
  Filled 2022-09-23: qty 1

## 2022-09-23 MED ORDER — FENTANYL CITRATE PF 50 MCG/ML IJ SOSY
50.0000 ug | PREFILLED_SYRINGE | INTRAMUSCULAR | Status: DC | PRN
  Administered 2022-09-23: 50 ug via INTRAVENOUS
  Filled 2022-09-23: qty 1

## 2022-09-23 MED ORDER — TAMSULOSIN HCL 0.4 MG PO CAPS
0.4000 mg | ORAL_CAPSULE | ORAL | Status: AC
  Administered 2022-09-23: 0.4 mg via ORAL
  Filled 2022-09-23: qty 1

## 2022-09-23 NOTE — Care Management (Signed)
PCP on AVS patient needs to call Monday for follow up

## 2022-09-23 NOTE — ED Triage Notes (Signed)
Patient here with ongoing left flank pain since diagnosed with kidney stone 10/5

## 2022-09-23 NOTE — ED Notes (Signed)
Pt provided discharge instructions and prescription information. Pt was given the opportunity to ask questions and questions were answered.   Pt teaching provided on medications that may cause drowsiness. Pt instructed not to drive or operate heavy machinery while taking the prescribed medication. Pt verbalized understanding.   

## 2022-09-23 NOTE — Discharge Instructions (Addendum)
I talked to Dr. Kathrynn Ducking who recommends you call [facility] tomorrow.  Stop taking ibuprofen.  Continue taking Tylenol and I can make sure you do not take more than 4000 mg of tylenol in one day (the vicodin/norco has 325 each)

## 2022-09-23 NOTE — ED Provider Notes (Signed)
[facility] EMERGENCY DEPARTMENT Provider Note   CSN: [id number] Arrival date & time: 09/23/22  0700     History  No chief complaint on file.   Alan Vaughn is a 41 y.o. male.  HPI Patient's wife would prefer to translate for this Spanish-speaking male.  Alan Vaughn agrees and states that he would prefer his wife translate instead of formal translator.  Patient is a 41 year old male presented emergency recently room today with complaints of left-sided flank pain that started 10/5 and has been persistent since.  He was seen in emergency room on the day that his symptoms began and was diagnosed with a 4 mm ureteral stone.  He states that he has had frequent episodes of nausea and vomiting that have been nonbloody nonbilious.  He states that he has a history of kidney stones and that this feels consistent with his previous kidney stones.  He states however that the pain has been more persistent and has been lasting longer.  He denies any chest pain or difficulty breathing.  He still is making urine but states that he has low abdominal pain when he does pee.     Home Medications Prior to Admission medications   Medication Sig Start Date End Date Taking? Authorizing Provider  HYDROcodone-acetaminophen (NORCO/VICODIN) 5-325 MG tablet Take 2 tablets by mouth every 4 (four) hours as needed. 09/23/22  Yes Padraic Marinos, Stevphen Meuse S, PA  tamsulosin (FLOMAX) 0.4 MG CAPS capsule Take 1 capsule (0.4 mg total) by mouth daily after supper. 09/23/22  Yes Stevin Bielinski, Stevphen Meuse S, PA  acetaminophen (TYLENOL) 500 MG tablet Take 1 tablet (500 mg total) by mouth every 6 (six) hours as needed. 09/20/22   Derwood Kaplan, MD  cephALEXin (KEFLEX) 500 MG capsule Take 1 capsule (500 mg total) by mouth 4 (four) times daily. Patient not taking: Reported on 09/20/2022 12/01/16   Antony Madura, PA-C  cyclobenzaprine (FLEXERIL) 10 MG tablet Take 1 tablet (10 mg total) by mouth at bedtime as needed for muscle  spasms. Patient not taking: Reported on 09/20/2022 08/26/21   Placido Sou, PA-C  doxycycline (VIBRAMYCIN) 100 MG capsule Take 1 capsule (100 mg total) by mouth 2 (two) times daily. Patient not taking: Reported on 09/20/2022 09/14/18   Horton, Mayer Masker, MD  ibuprofen (ADVIL) 600 MG tablet Take 1 tablet (600 mg total) by mouth every 6 (six) hours as needed. 09/20/22   Derwood Kaplan, MD  nystatin cream (MYCOSTATIN) Apply to foreskin 2 times daily Patient not taking: Reported on 09/20/2022 12/01/16   Antony Madura, PA-C  ondansetron (ZOFRAN-ODT) 8 MG disintegrating tablet Take 1 tablet (8 mg total) by mouth every 8 (eight) hours as needed for nausea. 09/20/22   Derwood Kaplan, MD      Allergies    Patient has no known allergies.    Review of Systems   Review of Systems  Physical Exam Updated Vital Signs BP 121/85   Pulse 87   Temp 98.1 F (36.7 C) (Oral)   Resp 16   SpO2 98%  Physical Exam Vitals and nursing note reviewed.  Constitutional:      General: He is in acute distress.     Comments: Uncomfortable 41 year old male  HENT:     Head: Normocephalic and atraumatic.     Nose: Nose normal.  Eyes:     General: No scleral icterus. Cardiovascular:     Rate and Rhythm: Normal rate and regular rhythm.     Pulses: Normal pulses.     Heart  sounds: Normal heart sounds.  Pulmonary:     Effort: Pulmonary effort is normal. No respiratory distress.     Breath sounds: No wheezing.  Abdominal:     Palpations: Abdomen is soft.     Tenderness: There is abdominal tenderness. There is no guarding or rebound.     Comments: Suprapubic tenderness to palpation  Musculoskeletal:     Cervical back: Normal range of motion.     Right lower leg: No edema.     Left lower leg: No edema.  Skin:    General: Skin is warm and dry.     Capillary Refill: Capillary refill takes less than 2 seconds.  Neurological:     Mental Status: He is alert. Mental status is at baseline.  Psychiatric:         Mood and Affect: Mood normal.        Behavior: Behavior normal.     ED Results / Procedures / Treatments   Labs (all labs ordered are listed, but only abnormal results are displayed) Labs Reviewed  COMPREHENSIVE METABOLIC PANEL - Abnormal; Notable for the following components:      Result Value   Glucose, Bld 268 (*)    BUN 30 (*)    Creatinine, Ser 1.91 (*)    Total Bilirubin 1.8 (*)    GFR, Estimated 45 (*)    All other components within normal limits  CBC - Abnormal; Notable for the following components:   WBC 12.6 (*)    MCV 79.1 (*)    All other components within normal limits  URINALYSIS, ROUTINE W REFLEX MICROSCOPIC - Abnormal; Notable for the following components:   Glucose, UA >=500 (*)    Hgb urine dipstick SMALL (*)    All other components within normal limits    EKG None  Radiology CT Renal Stone Study  Result Date: 09/23/2022 CLINICAL DATA:  Flank pain with kidney stone suspected. Ureteral stone now with acute kidney injury and continued pain. EXAM: CT ABDOMEN AND PELVIS WITHOUT CONTRAST TECHNIQUE: Multidetector CT imaging of the abdomen and pelvis was performed following the standard protocol without IV contrast. RADIATION DOSE REDUCTION: This exam was performed according to the departmental dose-optimization program which includes automated exposure control, adjustment of the mA and/or kV according to patient size and/or use of iterative reconstruction technique. COMPARISON:  Three days ago FINDINGS: Lower chest:  No contributory findings. Hepatobiliary: No focal liver abnormality. Hepatic steatosis.No evidence of biliary obstruction or stone. Pancreas: Unremarkable. Spleen: Unremarkable. Adrenals/Urinary Tract: Negative adrenals. Left hydronephrosis and low-density renal expansion with perinephric edema. Mild progression of known left ureteral stone which measures 4 mm just above the iliac crossing. No additional nephrolithiasis. No right hydronephrosis. Unremarkable  bladder. Stomach/Bowel:  No obstruction. No visible bowel inflammation. Vascular/Lymphatic: No acute vascular abnormality. No mass or adenopathy. Reproductive:No pathologic findings. Other: No ascites or pneumoperitoneum. Musculoskeletal: No acute abnormalities. Benign sclerosis in the right sacral ala. IMPRESSION: 1. Obstructing 4 mm left ureteral calculus. Mild progression of the stone since CT 3 days ago, now at the level of L4-5. 2. Hepatic steatosis. Electronically Signed   By: Jorje Guild M.D.   On: 09/23/2022 12:24    Procedures Procedures    Medications Ordered in ED Medications  fentaNYL (SUBLIMAZE) injection 50 mcg (50 mcg Intravenous Given 09/23/22 1154)  LORazepam (ATIVAN) 2 MG/ML injection (has no administration in time range)  lactated ringers bolus 1,000 mL (0 mLs Intravenous Stopped 09/23/22 1300)  metoCLOPramide (REGLAN) injection 10 mg (10 mg  Intravenous Given 09/23/22 1151)  diphenhydrAMINE (BENADRYL) capsule 25 mg (25 mg Oral Given 09/23/22 1150)  HYDROcodone-acetaminophen (NORCO/VICODIN) 5-325 MG per tablet 1 tablet (1 tablet Oral Given 09/23/22 1401)  tamsulosin (FLOMAX) capsule 0.4 mg (0.4 mg Oral Given 09/23/22 1401)    ED Course/ Medical Decision Making/ A&P Clinical Course as of 09/23/22 1641  Sun Sep 23, 2022  1406 Discussed close follow up with Jillyn Ledger Who will ensure that he is seen closely.  [WF]    Clinical Course User Index [WF] Gailen Shelter, PA                           Medical Decision Making Amount and/or Complexity of Data Reviewed Labs: ordered. Radiology: ordered.  Risk Prescription drug management.   This patient presents to the ED for concern of L flank pain and suprapubic pain, this involves a number of treatment options, and is a complaint that carries with it a moderate risk of complications and morbidity.    Co morbidities: Discussed in HPI   Brief History:  Patient's wife would prefer to translate for this  Spanish-speaking male.  Alan Vaughn agrees and states that he would prefer his wife translate instead of formal translator.  Patient is a 41 year old male presented emergency recently room today with complaints of left-sided flank pain that started 10/5 and has been persistent since.  He was seen in emergency room on the day that his symptoms began and was diagnosed with a 4 mm ureteral stone.  He states that he has had frequent episodes of nausea and vomiting that have been nonbloody nonbilious.  He states that he has a history of kidney stones and that this feels consistent with his previous kidney stones.  He states however that the pain has been more persistent and has been lasting longer.  He denies any chest pain or difficulty breathing.  He still is making urine but states that he has low abdominal pain when he does pee.    EMR reviewed including pt PMHx, past surgical history and past visits to ER.   See HPI for more details   Lab Tests:   I ordered and independently interpreted labs. Labs notable for    Imaging Studies:  Abnormal findings. I personally reviewed all imaging studies. Imaging notable for Some progressive movement of 4 mm ureteral stone.   Cardiac Monitoring:  NA NA   Medicines ordered:  I ordered medication including sentinel, Vicodin, lactated Ringer's, Reglan, Benadryl, tamsulosin for pain, nausea Reevaluation of the patient after these medicines showed that the patient improved I have reviewed the patients home medicines and have made adjustments as needed   Critical Interventions:     Consults/Attending Physician   I discussed this case with my attending physician who cosigned this note including patient's presenting symptoms, physical exam, and planned diagnostics and interventions. Attending physician stated agreement with plan or made changes to plan which were implemented.  Discussed case with urology Dr. Deno Etienne who will ensure close  urology follow-up.  Reevaluation:  After the interventions noted above I re-evaluated patient and found that they have :improved   Social Determinants of Health:  Social work/case management involved uninsured and without PCP, TOC consulted.     Problem List / ED Course:  Ureterolithiasis with elevation in creatinine and BUN likely prerenal due to dehydration from nausea and vomiting. Kidney stone causing nausea and vomiting and above problem. Hyperglycemia.  Will need PCP follow-up.  Hydrated here we will hold off on initiating metformin given patient's current GI symptoms.   Dispostion:  After consideration of the diagnostic results and the patients response to treatment, I feel that the patent would benefit from close outpatient follow-up.    Final Clinical Impression(s) / ED Diagnoses Final diagnoses:  Ureterolithiasis  Hyperglycemia    Rx / DC Orders ED Discharge Orders          Ordered    HYDROcodone-acetaminophen (NORCO/VICODIN) 5-325 MG tablet  Every 4 hours PRN        09/23/22 1425    tamsulosin (FLOMAX) 0.4 MG CAPS capsule  Daily after supper        09/23/22 1425              Solon Augusta [place], [place] 09/23/22 1641    Gwyneth Sprout, MD 09/23/22 2055

## 2022-09-24 ENCOUNTER — Other Ambulatory Visit: Payer: Self-pay

## 2022-09-24 ENCOUNTER — Ambulatory Visit: Payer: Self-pay

## 2022-09-24 MED ORDER — TRAMADOL HCL 50 MG PO TABS
50.0000 mg | ORAL_TABLET | Freq: Four times a day (QID) | ORAL | 0 refills | Status: DC | PRN
  Filled 2022-09-24: qty 30, 4d supply, fill #0

## 2022-09-24 NOTE — Telephone Encounter (Signed)
  Chief Complaint: left flank pain Symptoms: severe pain 1 hour after taking Vicodin and nausea from pain Frequency: Since Thursday Pertinent Negatives: Patient denies n/a Disposition: [x] ED /[] Urgent Care (no appt availability in office) / [] Appointment(In office/virtual)/ []  [facility]/ [] Home Care/ [] Refused Recommended Disposition /[] [facility]/ []  Follow-up with PCP Additional Notes: advised wife to call back to schedule hospital f/u at [facility] Reason for Disposition  [1] SEVERE pain (e.g., excruciating, scale 8-10) AND [2] not improved after pain medicine  Answer Assessment - Initial Assessment Questions 1. LOCATION: "Where does it hurt?" (e.g., left, right)     left 2. ONSET: "When did the pain start?"     Thursday 3. SEVERITY: "How bad is the pain?" (e.g., Scale 1-10; mild, moderate, or severe)   - MILD (1-3): doesn't interfere with normal activities    - MODERATE (4-7): interferes with normal activities or awakens from sleep    - SEVERE (8-10): excruciating pain and patient unable to do normal activities (stays in bed)       severe 4. PATTERN: "Does the pain come and go, or is it constant?"      constant 5. CAUSE: "What do you think is causing the pain?"     ? Kidney related  6. OTHER SYMPTOMS:  "Do you have any other symptoms?" (e.g., fever, abdomen pain, vomiting, leg weakness, burning with urination, blood in urine)     Nausea, 7. PREGNANCY:  "Is there any chance you are pregnant?" "When was your last menstrual period?"     N/a  Protocols used: Flank Pain-A-AH

## 2022-09-26 ENCOUNTER — Other Ambulatory Visit: Payer: Self-pay

## 2022-09-26 ENCOUNTER — Encounter (HOSPITAL_COMMUNITY): Payer: Self-pay | Admitting: Emergency Medicine

## 2022-09-26 ENCOUNTER — Emergency Department (HOSPITAL_COMMUNITY): Payer: Self-pay

## 2022-09-26 ENCOUNTER — Emergency Department (HOSPITAL_COMMUNITY)
Admission: EM | Admit: 2022-09-26 | Discharge: 2022-09-27 | Discharge status: Against Medical Advice | Payer: Self-pay | Attending: Student | Admitting: Student

## 2022-09-26 DIAGNOSIS — R112 Nausea with vomiting, unspecified: Secondary | ICD-10-CM | POA: Insufficient documentation

## 2022-09-26 DIAGNOSIS — Z5321 Procedure and treatment not carried out due to patient leaving prior to being seen by health care provider: Secondary | ICD-10-CM | POA: Insufficient documentation

## 2022-09-26 DIAGNOSIS — Z87442 Personal history of urinary calculi: Secondary | ICD-10-CM | POA: Insufficient documentation

## 2022-09-26 DIAGNOSIS — R509 Fever, unspecified: Secondary | ICD-10-CM | POA: Insufficient documentation

## 2022-09-26 DIAGNOSIS — R1032 Left lower quadrant pain: Secondary | ICD-10-CM | POA: Insufficient documentation

## 2022-09-26 LAB — COMPREHENSIVE METABOLIC PANEL
ALT: 25 U/L (ref 0–44)
AST: 17 U/L (ref 15–41)
Albumin: 3.9 g/dL (ref 3.5–5.0)
Alkaline Phosphatase: 96 U/L (ref 38–126)
Anion gap: 13 (ref 5–15)
BUN: 27 mg/dL — ABNORMAL HIGH (ref 6–20)
CO2: 25 mmol/L (ref 22–32)
Calcium: 9.6 mg/dL (ref 8.9–10.3)
Chloride: 99 mmol/L (ref 98–111)
Creatinine, Ser: 1.84 mg/dL — ABNORMAL HIGH (ref 0.61–1.24)
GFR, Estimated: 48 mL/min — ABNORMAL LOW (ref >60)
Glucose, Bld: 133 mg/dL — ABNORMAL HIGH (ref 70–99)
Potassium: 4.3 mmol/L (ref 3.5–5.1)
Sodium: 137 mmol/L (ref 135–145)
Total Bilirubin: 1.5 mg/dL — ABNORMAL HIGH (ref 0.3–1.2)
Total Protein: 8.2 g/dL — ABNORMAL HIGH (ref 6.5–8.1)

## 2022-09-26 LAB — CBC WITH DIFFERENTIAL/PLATELET
Abs Immature Granulocytes: 0.04 10*3/uL (ref 0.00–0.07)
Basophils Absolute: 0 10*3/uL (ref 0.0–0.1)
Basophils Relative: 0 %
Eosinophils Absolute: 0 10*3/uL (ref 0.0–0.5)
Eosinophils Relative: 0 %
HCT: 41 % (ref 39.0–52.0)
Hemoglobin: 13.7 g/dL (ref 13.0–17.0)
Immature Granulocytes: 0 %
Lymphocytes Relative: 13 %
Lymphs Abs: 1.4 10*3/uL (ref 0.7–4.0)
MCH: 26.9 pg (ref 26.0–34.0)
MCHC: 33.4 g/dL (ref 30.0–36.0)
MCV: 80.6 fL (ref 80.0–100.0)
Monocytes Absolute: 0.8 10*3/uL (ref 0.1–1.0)
Monocytes Relative: 7 %
Neutro Abs: 8.9 10*3/uL — ABNORMAL HIGH (ref 1.7–7.7)
Neutrophils Relative %: 80 %
Platelets: 244 10*3/uL (ref 150–400)
RBC: 5.09 MIL/uL (ref 4.22–5.81)
RDW: 12.7 % (ref 11.5–15.5)
WBC: 11.2 10*3/uL — ABNORMAL HIGH (ref 4.0–10.5)
nRBC: 0 % (ref 0.0–0.2)

## 2022-09-26 LAB — URINALYSIS, ROUTINE W REFLEX MICROSCOPIC
Bacteria, UA: NONE SEEN
Bilirubin Urine: NEGATIVE
Glucose, UA: NEGATIVE mg/dL
Ketones, ur: 20 mg/dL — AB
Leukocytes,Ua: NEGATIVE
Nitrite: NEGATIVE
Protein, ur: NEGATIVE mg/dL
Specific Gravity, Urine: 1.018 (ref 1.005–1.030)
pH: 5 (ref 5.0–8.0)

## 2022-09-26 LAB — LIPASE, BLOOD: Lipase: 25 U/L (ref 11–51)

## 2022-09-26 NOTE — ED Triage Notes (Signed)
Pt brought to ED with c/o severe flank pain that has been ongoing x1 week. States he is also experiencing severe nausea and vomiting.

## 2022-09-26 NOTE — ED Provider Triage Note (Signed)
Emergency Medicine Provider Triage Evaluation Note  Bladen Lucarelli , a 41 y.o. male  was evaluated in triage.  Pt complains of left flank pain and left lower quadrant abdominal pain.  Patient report associated nausea and vomiting.  History of known kidney stone.  Followed by urology.  Patient with persistent symptoms.  Intermittent subjective fevers.  Review of Systems  Positive: Flank pain, vomiting, fever Negative: Dysuria, hematuria  Physical Exam  There were no vitals taken for this visit. Gen:   Awake, no distress   Resp:  Normal effort  MSK:   Moves extremities without difficulty  Other:  Left sided cva tenderness  Medical Decision Making  Medically screening exam initiated at 8:57 PM.  Appropriate orders placed.  Blayne Beauregard was informed that the remainder of the evaluation will be completed by another provider, this initial triage assessment does not replace that evaluation, and the importance of remaining in the ED until their evaluation is complete.  Patient with known kidney stone.  Ongoing pain with associated nausea and vomiting subjective fevers.  Labs and urine pending at this time.   Doristine Devoid, PA-C 09/26/22 [id number]

## 2022-09-27 ENCOUNTER — Other Ambulatory Visit: Payer: Self-pay

## 2022-09-27 MED ORDER — ONDANSETRON HCL 4 MG PO TABS
4.0000 mg | ORAL_TABLET | ORAL | 1 refills | Status: DC | PRN
  Filled 2022-09-27: qty 20, 4d supply, fill #0

## 2022-09-27 NOTE — ED Notes (Signed)
CALL PATIENT X4 NO ANSWER

## 2022-10-03 ENCOUNTER — Other Ambulatory Visit: Payer: Self-pay

## 2022-11-23 ENCOUNTER — Encounter (INDEPENDENT_AMBULATORY_CARE_PROVIDER_SITE_OTHER): Payer: Self-pay | Admitting: Primary Care

## 2022-11-23 ENCOUNTER — Other Ambulatory Visit: Payer: Self-pay

## 2022-11-23 ENCOUNTER — Ambulatory Visit (INDEPENDENT_AMBULATORY_CARE_PROVIDER_SITE_OTHER): Payer: Self-pay | Admitting: Primary Care

## 2022-11-23 VITALS — BP 121/80 | HR 68 | Temp 98.0°F | Ht 67.0 in | Wt 201.0 lb

## 2022-11-23 DIAGNOSIS — Z6831 Body mass index (BMI) 31.0-31.9, adult: Secondary | ICD-10-CM

## 2022-11-23 DIAGNOSIS — Z7689 Persons encountering health services in other specified circumstances: Secondary | ICD-10-CM

## 2022-11-23 DIAGNOSIS — Z2821 Immunization not carried out because of patient refusal: Secondary | ICD-10-CM

## 2022-11-23 DIAGNOSIS — Z131 Encounter for screening for diabetes mellitus: Secondary | ICD-10-CM

## 2022-11-23 DIAGNOSIS — E669 Obesity, unspecified: Secondary | ICD-10-CM | POA: Insufficient documentation

## 2022-11-23 DIAGNOSIS — Z1159 Encounter for screening for other viral diseases: Secondary | ICD-10-CM

## 2022-11-23 DIAGNOSIS — E6609 Other obesity due to excess calories: Secondary | ICD-10-CM

## 2022-11-23 DIAGNOSIS — E119 Type 2 diabetes mellitus without complications: Secondary | ICD-10-CM

## 2022-11-23 DIAGNOSIS — Z114 Encounter for screening for human immunodeficiency virus [HIV]: Secondary | ICD-10-CM

## 2022-11-23 LAB — POCT GLYCOSYLATED HEMOGLOBIN (HGB A1C): HbA1c, POC (controlled diabetic range): 8.4 % — AB (ref 0.0–7.0)

## 2022-11-23 MED ORDER — GLIPIZIDE 10 MG PO TABS
10.0000 mg | ORAL_TABLET | Freq: Two times a day (BID) | ORAL | 1 refills | Status: AC
  Filled 2022-11-23 – 2022-12-24 (×2): qty 180, 90d supply, fill #0

## 2022-11-23 MED ORDER — METFORMIN HCL 1000 MG PO TABS
1000.0000 mg | ORAL_TABLET | Freq: Two times a day (BID) | ORAL | 1 refills | Status: AC
  Filled 2022-11-23 – 2022-12-24 (×2): qty 180, 90d supply, fill #0

## 2022-11-23 NOTE — Progress Notes (Signed)
New Patient Office Visit  Subjective    Patient ID: Alan Vaughn, male    DOB: [birth year]  Age: 41 y.o. MRN: [id number]  CC:  Chief Complaint  Patient presents with   New Patient (Initial Visit)   HPI [name] is a 41 year old obese Hispanic male(Elizabeth is present is his wife patient agrees for her to be a visit and to interpret for him. ) Presents to establish care.  He has voiced concerns of having headaches and had kidney stones in October 2023.  He denies polyuria, polydipsia, polyphasia or vision changes.Patient has No chest pain, No abdominal pain - No Nausea, No new weakness tingling or numbness, No Cough - shortness of breath.   Outpatient Encounter Medications as of 11/23/2022  Medication Sig   acetaminophen (TYLENOL) 500 MG tablet Take 1 tablet (500 mg total) by mouth every 6 (six) hours as needed. (Patient not taking: Reported on 11/23/2022)   cephALEXin (KEFLEX) 500 MG capsule Take 1 capsule (500 mg total) by mouth 4 (four) times daily. (Patient not taking: Reported on 09/20/2022)   cyclobenzaprine (FLEXERIL) 10 MG tablet Take 1 tablet (10 mg total) by mouth at bedtime as needed for muscle spasms. (Patient not taking: Reported on 09/20/2022)   doxycycline (VIBRAMYCIN) 100 MG capsule Take 1 capsule (100 mg total) by mouth 2 (two) times daily. (Patient not taking: Reported on 09/20/2022)   HYDROcodone-acetaminophen (NORCO/VICODIN) 5-325 MG tablet Take 2 tablets by mouth every 4 (four) hours as needed. (Patient not taking: Reported on 11/23/2022)   ibuprofen (ADVIL) 600 MG tablet Take 1 tablet (600 mg total) by mouth every 6 (six) hours as needed. (Patient not taking: Reported on 11/23/2022)   nystatin cream (MYCOSTATIN) Apply to foreskin 2 times daily (Patient not taking: Reported on 09/20/2022)   ondansetron (ZOFRAN) 4 MG tablet Take 1 tablet (4 mg total) by mouth every 4 (four) hours as needed. (Patient not taking: Reported on 11/23/2022)   ondansetron (ZOFRAN-ODT) 8 MG  disintegrating tablet Take 1 tablet (8 mg total) by mouth every 8 (eight) hours as needed for nausea. (Patient not taking: Reported on 11/23/2022)   tamsulosin (FLOMAX) 0.4 MG CAPS capsule Take 1 capsule (0.4 mg total) by mouth daily after supper. (Patient not taking: Reported on 11/23/2022)   traMADol (ULTRAM) 50 MG tablet Take 1-2 tablets (50 mg total) by mouth every 6 (six) hours as needed. (Patient not taking: Reported on 11/23/2022)   No facility-administered encounter medications on file as of 11/23/2022.    No past medical history on file.  No past surgical history on file.  No family history on file.  Social History   Socioeconomic History   Marital status: Significant Other    Spouse name: Not on file   Number of children: Not on file   Years of education: Not on file   Highest education level: Not on file  Occupational History   Not on file  Tobacco Use   Smoking status: Never   Smokeless tobacco: Never  Substance and Sexual Activity   Alcohol use: No   Drug use: No   Sexual activity: Not on file  Other Topics Concern   Not on file  Social History Narrative   Not on file   Social Determinants of Health   Financial Resource Strain: Not on file  Food Insecurity: Not on file  Transportation Needs: Not on file  Physical Activity: Not on file  Stress: Not on file  Social Connections: Not on file  Intimate  Partner Violence: Not on file    ROS Comprehensive ROS Pertinent positive and negative noted in HPI   Objective    Blood Pressure 121/80   Pulse 68   Temperature 98 F (36.7 C) (Oral)   Height 5\' 7"  (1.702 m)   Weight 201 lb (91.2 kg)   Oxygen Saturation 99%   Body Mass Index 31.48 kg/m   Physical Exam General: No apparent distress. Eyes: Extraocular eye movements intact, pupils equal and round. Neck: Supple, trachea midline. Thyroid: No enlargement, mobile without fixation, no tenderness. Cardiovascular: Regular rhythm and rate, no murmur, normal  radial pulses. Respiratory: Normal respiratory effort, clear to auscultation. Gastrointestinal: Normal pitch active bowel sounds, nontender abdomen without distention or appreciable hepatomegaly. Musculoskeletal: Normal muscle tone, no tenderness on palpation of tibia, no excessive thoracic kyphosis. Skin: Appropriate warmth, no visible rash. Mental status: Alert, conversant, speech clear, thought logical, appropriate mood and affect, no hallucinations or delusions evident. Hematologic/lymphatic: No cervical adenopathy, no visible ecchymoses.    Assessment & Plan:  Azure was seen today for new patient (initial visit).  Diagnoses and all orders for this visit:  Screening for diabetes mellitus (DM) -     POCT glycosylated hemoglobin (Hb A1C)  Encounter to establish care -    Type 2 diabetes mellitus without complication, without long-term current use of insulin (HCC) New dx- - educated on lifestyle modifications, including but not limited to diet choices and adding exercise to daily routine.    -     Microalbumin / creatinine urine ratio              Lipid Panel Encounter for HCV screening test for low risk patient -     HCV Ab w Reflex to Quant PCR  Encounter for screening for HIV -     HIV Antibody (routine testing w rflx)  COVID-19 vaccination declined  Influenza vaccination declined  Other orders -     Tdap vaccine greater than or equal to 7yo IM -     metFORMIN (GLUCOPHAGE) 1000 MG tablet; Take 1 tablet (1,000 mg total) by mouth 2 (two) times daily with a meal. -     glipiZIDE (GLUCOTROL) 10 MG tablet; Take 1 tablet (10 mg total) by mouth 2 (two) times daily before a meal.  , NP

## 2022-11-23 NOTE — Patient Instructions (Signed)
Diabetes mellitus y nutricin, en adultos Diabetes Mellitus and Nutrition, Adult Si sufre de diabetes, o diabetes mellitus, es muy importante tener hbitos alimenticios saludables debido a que sus niveles de azcar en la sangre (glucosa) se ven afectados en gran medida por lo que come y bebe. Comer alimentos saludables en las cantidades correctas, aproximadamente a la misma hora todos los das, lo ayudar a: Controlar su glucemia. Disminuir el riesgo de sufrir una enfermedad cardaca. Mejorar la presin arterial. Alcanzar o mantener un peso saludable. Qu puede afectar mi plan de alimentacin? Todas las personas que sufren de diabetes son diferentes y cada una tiene necesidades diferentes en cuanto a un plan de alimentacin. El mdico puede recomendarle que trabaje con un nutricionista para elaborar el mejor plan para usted. Su plan de alimentacin puede variar segn factores como: Las caloras que necesita. Los medicamentos que toma. Su peso. Sus niveles de glucemia, presin arterial y colesterol. Su nivel de actividad. Otras afecciones que tenga, como enfermedades cardacas o renales. Cmo me afectan los carbohidratos? Los carbohidratos, o hidratos de carbono, afectan su nivel de glucemia ms que cualquier otro tipo de alimento. La ingesta de carbohidratos aumenta la cantidad de glucosa en la sangre. Es importante conocer la cantidad de carbohidratos que se pueden ingerir en cada comida sin correr ningn riesgo. Esto es diferente en cada persona. Su nutricionista puede ayudarlo a calcular la cantidad de carbohidratos que debe ingerir en cada comida y en cada refrigerio. Cmo me afecta el alcohol? El alcohol puede provocar una disminucin de la glucemia (hipoglucemia), especialmente si usa insulina o toma determinados medicamentos por va oral para la diabetes. La hipoglucemia es una afeccin potencialmente mortal. Los sntomas de la hipoglucemia, como somnolencia, mareos y confusin, son  similares a los sntomas de haber consumido demasiado alcohol. No beba alcohol si: Su mdico le indica no hacerlo. Est embarazada, puede estar embarazada o est tratando de quedar embarazada. Si bebe alcohol: Limite la cantidad que bebe a lo siguiente: De 0 a 1 medida por da para las mujeres. De 0 a 2 medidas por da para los hombres. Sepa cunta cantidad de alcohol hay en las bebidas que toma. En los [place], una medida equivale a una botella de cerveza de 12 oz (355 ml), un vaso de vino de 5 oz (148 ml) o un vaso de una bebida alcohlica de alta graduacin de 1 oz (44 ml). Mantngase hidratado bebiendo agua, refrescos dietticos o t helado sin azcar. Tenga en cuenta que los refrescos comunes, los jugos y otras bebidas para mezclar pueden contener mucha azcar y se deben contar como carbohidratos. Consejos para seguir este plan  Leer las etiquetas de los alimentos Comience por leer el tamao de la porcin en la etiqueta de Informacin nutricional de los alimentos envasados y las bebidas. La cantidad de caloras, carbohidratos, grasas y otros nutrientes detallados en la etiqueta se basan en una porcin del alimento. Muchos alimentos contienen ms de una porcin por envase. Verifique la cantidad total de gramos (g) de carbohidratos totales en una porcin. Verifique la cantidad de gramos de grasas saturadas y grasas trans en una porcin. Escoja alimentos que no contengan estas grasas o que su contenido de estas sea bajo. Verifique la cantidad de miligramos (mg) de sal (sodio) en una porcin. La mayora de las personas deben limitar la ingesta de sodio total a menos de 2300 mg por da. Siempre consulte la informacin nutricional de los alimentos etiquetados como "con bajo contenido de grasa" o "sin grasa".   Estos alimentos pueden tener un mayor contenido de azcar agregada o carbohidratos refinados, y deben evitarse. Hable con su nutricionista para identificar sus objetivos diarios en  cuanto a los nutrientes mencionados en la etiqueta. Al ir de compras Evite comprar alimentos procesados, enlatados o precocidos. Estos alimentos tienden a tener una mayor cantidad de grasa, sodio y azcar agregada. Compre en la zona exterior de la tienda de comestibles. Esta es la zona donde se encuentran con mayor frecuencia las frutas y las verduras frescas, los cereales a granel, las carnes frescas y los productos lcteos frescos. Al cocinar Use mtodos de coccin a baja temperatura, como hornear, en lugar de mtodos de coccin a alta temperatura, como frer en abundante aceite. Cocine con aceites saludables, como el aceite de oliva, canola o girasol. Evite cocinar con manteca, crema o carnes con alto contenido de grasa. Planificacin de las comidas Coma las comidas y los refrigerios regularmente, preferentemente a la misma hora todos los das. Evite pasar largos perodos de tiempo sin comer. Consuma alimentos ricos en fibra, como frutas frescas, verduras, frijoles y cereales integrales. Consuma entre 4 y 6 onzas (entre 112 y 168 g) de protenas magras por da, como carnes magras, pollo, pescado, huevos o tofu. Una onza (oz) (28 g) de protena magra equivale a: 1 onza (28 g) de carne, pollo o pescado. 1 huevo.  taza (62 g) de tofu. Coma algunos alimentos por da que contengan grasas saludables, como aguacates, frutos secos, semillas y pescado. Qu alimentos debo comer? Frutas Bayas. Manzanas. Naranjas. Duraznos. Damascos. Ciruelas. Uvas. Mangos. Papayas. Granadas. Kiwi. Cerezas. Verduras Verduras de hoja verde, que incluyen lechuga, espinaca, col rizada, acelga, hojas de berza, hojas de mostaza y repollo. Remolachas. Coliflor. Brcoli. Zanahorias. Judas verdes. Tomates. Pimientos. Cebollas. Pepinos. Coles de Bruselas. Granos Granos integrales, como panes, galletas, tortillas, cereales y pastas de salvado o integrales. Avena sin azcar. Quinua. Arroz integral o salvaje. Carnes y otras  protenas Frutos de mar. Carne de ave sin piel. Cortes magros de ave y carne de res. Tofu. Frutos secos. Semillas. Lcteos Productos lcteos sin grasa o con bajo contenido de grasa, como leche, yogur y queso. Es posible que los productos detallados arriba no constituyan una lista completa de los alimentos y las bebidas que puede tomar. Consulte a un nutricionista para obtener ms informacin. Qu alimentos debo evitar? Frutas Frutas enlatadas al almbar. Verduras Verduras enlatadas. Verduras congeladas con mantequilla o salsa de crema. Granos Productos elaborados con harina y harina blanca refinada, como panes, pastas, bocadillos y cereales. Evite todos los alimentos procesados. Carnes y otras protenas Cortes de carne con alto contenido de grasa. Carne de ave con piel. Carnes empanizadas o fritas. Carne procesada. Evite las grasas saturadas. Lcteos Yogur, queso o leche enteros. Bebidas Bebidas azucaradas, como gaseosas o t helado. Es posible que los productos que se enumeran ms arriba no constituyan una lista completa de los alimentos y las bebidas que debe evitar. Consulte a un nutricionista para obtener ms informacin. Preguntas para hacerle al mdico Debo consultar con un especialista certificado en atencin y educacin sobre la diabetes? Es necesario que me rena con un nutricionista? A qu nmero puedo llamar si tengo preguntas? Cules son los mejores momentos para controlar la glucemia? Dnde encontrar ms informacin: American Diabetes Association (Asociacin Estadounidense de la Diabetes): diabetes.org Academy of Nutrition and Dietetics (Academia de Nutricin y Diettica): eatright.org National Institute of Diabetes and Digestive and Kidney Diseases (Instituto Nacional de la Diabetes y las Enfermedades Digestivas y Renales): niddk.nih.gov Association of Diabetes   Care & Education Specialists (Asociacin de Especialistas en Atencin y Educacin sobre la Diabetes):  diabeteseducator.org Resumen Es importante tener hbitos alimenticios saludables debido a que sus niveles de azcar en la sangre (glucosa) se ven afectados en gran medida por lo que come y bebe. Es importante consumir alcohol con prudencia. Un plan de comidas saludable lo ayudar a controlar la glucosa en sangre y a reducir el riesgo de enfermedades cardacas. El mdico puede recomendarle que trabaje con un nutricionista para elaborar el mejor plan para usted. Esta informacin no tiene como fin reemplazar el consejo del mdico. Asegrese de hacerle al mdico cualquier pregunta que tenga. Document Revised: 08/10/2020 Document Reviewed: 08/10/2020 Elsevier Patient Education  2023 Elsevier Inc.  

## 2022-11-23 NOTE — Progress Notes (Signed)
Headaches Kidney stone in October Screening for diabetes.

## 2022-11-25 ENCOUNTER — Other Ambulatory Visit (INDEPENDENT_AMBULATORY_CARE_PROVIDER_SITE_OTHER): Payer: Self-pay | Admitting: Primary Care

## 2022-11-25 MED ORDER — ATORVASTATIN CALCIUM 40 MG PO TABS
40.0000 mg | ORAL_TABLET | Freq: Every day | ORAL | 1 refills | Status: AC
  Filled 2022-11-25: qty 90, 90d supply, fill #0
  Filled 2022-12-05: qty 30, 30d supply, fill #0
  Filled 2022-12-24: qty 90, 90d supply, fill #0

## 2022-11-26 ENCOUNTER — Other Ambulatory Visit: Payer: Self-pay

## 2022-11-27 LAB — HIV ANTIBODY (ROUTINE TESTING W REFLEX): HIV Screen 4th Generation wRfx: NONREACTIVE

## 2022-11-27 LAB — LIPID PANEL
Chol/HDL Ratio: 4.7 ratio (ref 0.0–5.0)
Cholesterol, Total: 188 mg/dL (ref 100–199)
HDL: 40 mg/dL (ref >39)
LDL Chol Calc (NIH): 118 mg/dL — ABNORMAL HIGH (ref 0–99)
Triglycerides: 168 mg/dL — ABNORMAL HIGH (ref 0–149)
VLDL Cholesterol Cal: 30 mg/dL (ref 5–40)

## 2022-11-27 LAB — MICROALBUMIN / CREATININE URINE RATIO
Creatinine, Urine: 145.3 mg/dL
Microalb/Creat Ratio: 10 mg/g creat (ref 0–29)
Microalbumin, Urine: 14.9 ug/mL

## 2022-11-27 LAB — HCV INTERPRETATION

## 2022-11-27 LAB — HCV AB W REFLEX TO QUANT PCR: HCV Ab: NONREACTIVE

## 2022-11-30 ENCOUNTER — Other Ambulatory Visit: Payer: Self-pay

## 2022-12-05 ENCOUNTER — Other Ambulatory Visit: Payer: Self-pay

## 2022-12-11 ENCOUNTER — Other Ambulatory Visit: Payer: Self-pay

## 2022-12-24 ENCOUNTER — Other Ambulatory Visit: Payer: Self-pay

## 2022-12-24 ENCOUNTER — Telehealth (INDEPENDENT_AMBULATORY_CARE_PROVIDER_SITE_OTHER): Payer: Self-pay

## 2022-12-24 NOTE — Telephone Encounter (Signed)
[facility] Mordecai Rasmussen [phone] contacted pt to go over lab results pt is aware and doesn't have any questions or concerns

## 2023-02-22 ENCOUNTER — Ambulatory Visit (INDEPENDENT_AMBULATORY_CARE_PROVIDER_SITE_OTHER): Payer: Self-pay | Admitting: Primary Care
